# Patient Record
Sex: Female | Born: 1950
Health system: Southern US, Community
[De-identification: ages and names within clinical notes are randomized; demographics above are authoritative.]

## PROBLEM LIST (undated history)

## (undated) DIAGNOSIS — R569 Unspecified convulsions: Secondary | ICD-10-CM

## (undated) DIAGNOSIS — D496 Neoplasm of unspecified behavior of brain: Secondary | ICD-10-CM

## (undated) DIAGNOSIS — D429 Neoplasm of uncertain behavior of meninges, unspecified: Secondary | ICD-10-CM

## (undated) DIAGNOSIS — R42 Dizziness and giddiness: Secondary | ICD-10-CM

## (undated) DIAGNOSIS — D493 Neoplasm of unspecified behavior of breast: Secondary | ICD-10-CM

## (undated) HISTORY — DX: Dizziness and giddiness: R42

## (undated) HISTORY — PX: BRAIN SURGERY: SHX531

## (undated) HISTORY — PX: BREAST SURGERY: SHX581

## (undated) HISTORY — DX: Neoplasm of uncertain behavior of meninges, unspecified: D42.9

## (undated) HISTORY — DX: Unspecified convulsions: R56.9

---

## 2001-08-21 ENCOUNTER — Emergency Department (HOSPITAL_COMMUNITY): Admission: EM | Admit: 2001-08-21 | Discharge: 2001-08-21 | Payer: Self-pay | Admitting: Emergency Medicine

## 2001-09-18 ENCOUNTER — Ambulatory Visit (HOSPITAL_COMMUNITY): Admission: RE | Admit: 2001-09-18 | Discharge: 2001-09-18 | Payer: Self-pay | Admitting: Neurology

## 2001-12-04 ENCOUNTER — Encounter: Payer: Self-pay | Admitting: Emergency Medicine

## 2001-12-05 ENCOUNTER — Inpatient Hospital Stay (HOSPITAL_COMMUNITY): Admission: EM | Admit: 2001-12-05 | Discharge: 2001-12-06 | Payer: Self-pay | Admitting: Emergency Medicine

## 2002-02-12 ENCOUNTER — Emergency Department (HOSPITAL_COMMUNITY): Admission: EM | Admit: 2002-02-12 | Discharge: 2002-02-13 | Payer: Self-pay | Admitting: Emergency Medicine

## 2002-02-12 ENCOUNTER — Encounter: Payer: Self-pay | Admitting: Emergency Medicine

## 2002-05-02 ENCOUNTER — Emergency Department (HOSPITAL_COMMUNITY): Admission: EM | Admit: 2002-05-02 | Discharge: 2002-05-02 | Payer: Self-pay | Admitting: Emergency Medicine

## 2002-05-21 ENCOUNTER — Other Ambulatory Visit: Admission: RE | Admit: 2002-05-21 | Discharge: 2002-05-21 | Payer: Self-pay | Admitting: Obstetrics and Gynecology

## 2004-05-22 ENCOUNTER — Other Ambulatory Visit: Admission: RE | Admit: 2004-05-22 | Discharge: 2004-05-22 | Payer: Self-pay | Admitting: Obstetrics and Gynecology

## 2005-03-16 ENCOUNTER — Emergency Department (HOSPITAL_COMMUNITY): Admission: EM | Admit: 2005-03-16 | Discharge: 2005-03-16 | Payer: Self-pay | Admitting: Emergency Medicine

## 2006-09-12 ENCOUNTER — Encounter: Admission: RE | Admit: 2006-09-12 | Discharge: 2006-09-12 | Payer: Self-pay | Admitting: Obstetrics and Gynecology

## 2006-09-26 ENCOUNTER — Emergency Department (HOSPITAL_COMMUNITY): Admission: EM | Admit: 2006-09-26 | Discharge: 2006-09-27 | Payer: Self-pay | Admitting: Emergency Medicine

## 2006-11-13 ENCOUNTER — Inpatient Hospital Stay (HOSPITAL_COMMUNITY): Admission: EM | Admit: 2006-11-13 | Discharge: 2006-11-14 | Payer: Self-pay | Admitting: Emergency Medicine

## 2007-09-24 ENCOUNTER — Encounter: Admission: RE | Admit: 2007-09-24 | Discharge: 2007-09-24 | Payer: Self-pay | Admitting: Obstetrics and Gynecology

## 2007-10-01 ENCOUNTER — Encounter: Admission: RE | Admit: 2007-10-01 | Discharge: 2007-10-01 | Payer: Self-pay | Admitting: Obstetrics and Gynecology

## 2007-11-20 ENCOUNTER — Encounter: Payer: Self-pay | Admitting: Internal Medicine

## 2008-01-04 ENCOUNTER — Ambulatory Visit: Payer: Self-pay | Admitting: Internal Medicine

## 2008-01-04 DIAGNOSIS — G40209 Localization-related (focal) (partial) symptomatic epilepsy and epileptic syndromes with complex partial seizures, not intractable, without status epilepticus: Secondary | ICD-10-CM | POA: Insufficient documentation

## 2008-01-04 DIAGNOSIS — F32A Depression, unspecified: Secondary | ICD-10-CM | POA: Insufficient documentation

## 2008-01-04 DIAGNOSIS — E785 Hyperlipidemia, unspecified: Secondary | ICD-10-CM

## 2008-01-04 DIAGNOSIS — F329 Major depressive disorder, single episode, unspecified: Secondary | ICD-10-CM

## 2008-01-06 ENCOUNTER — Encounter: Payer: Self-pay | Admitting: Internal Medicine

## 2008-04-11 ENCOUNTER — Ambulatory Visit: Payer: Self-pay | Admitting: Internal Medicine

## 2008-10-28 IMAGING — MG MM DIAGNOSTIC BILATERAL
3 series · 3 of 3 positions shown · non-contrast
Comparison: none

DG DIAGNOSTIC BILATERAL
Bilateral CC and MLO view(s) were taken.

RIGHT BREAST ULTRASOUND
Technologist: Lorenz Jumper, Medical
DIGITAL BILATERAL DIAGNOSTIC MAMMOGRAM WITH CAD AND RIGHT BREAST ULTRASOUND:
CLINICAL DATA: Right breast lump at 3 o'clock.

[L MLO]
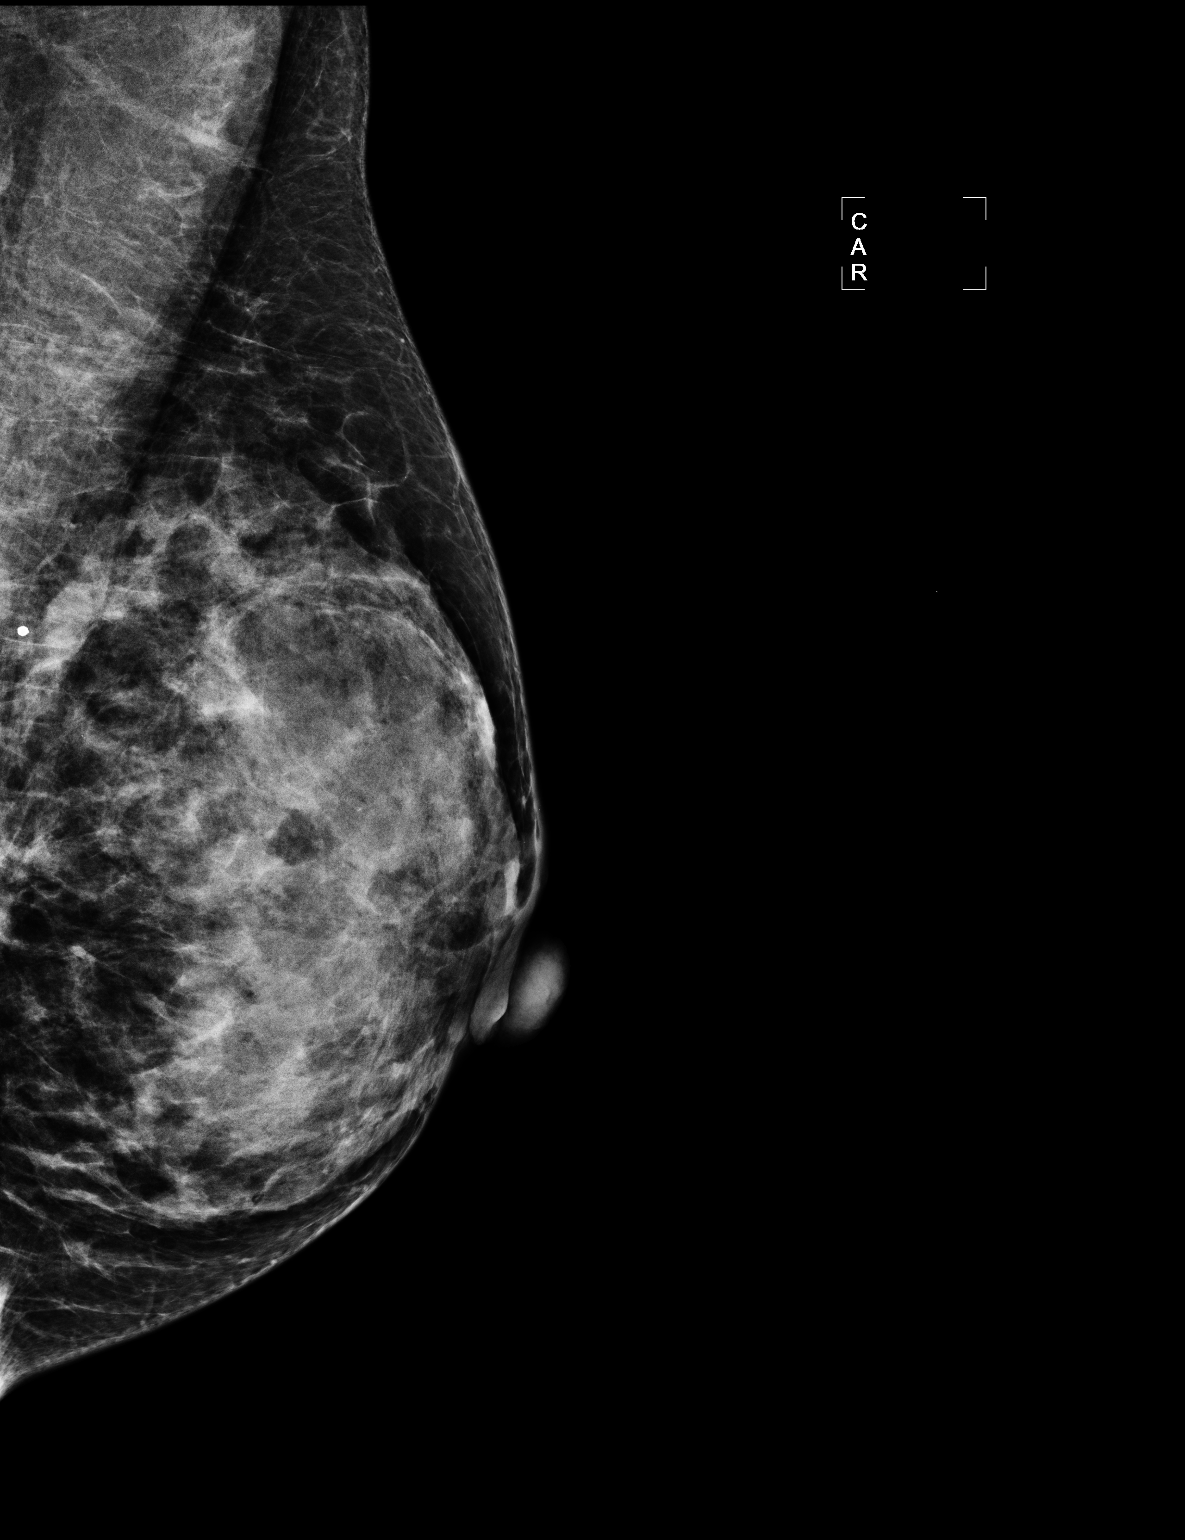

[R MLO]
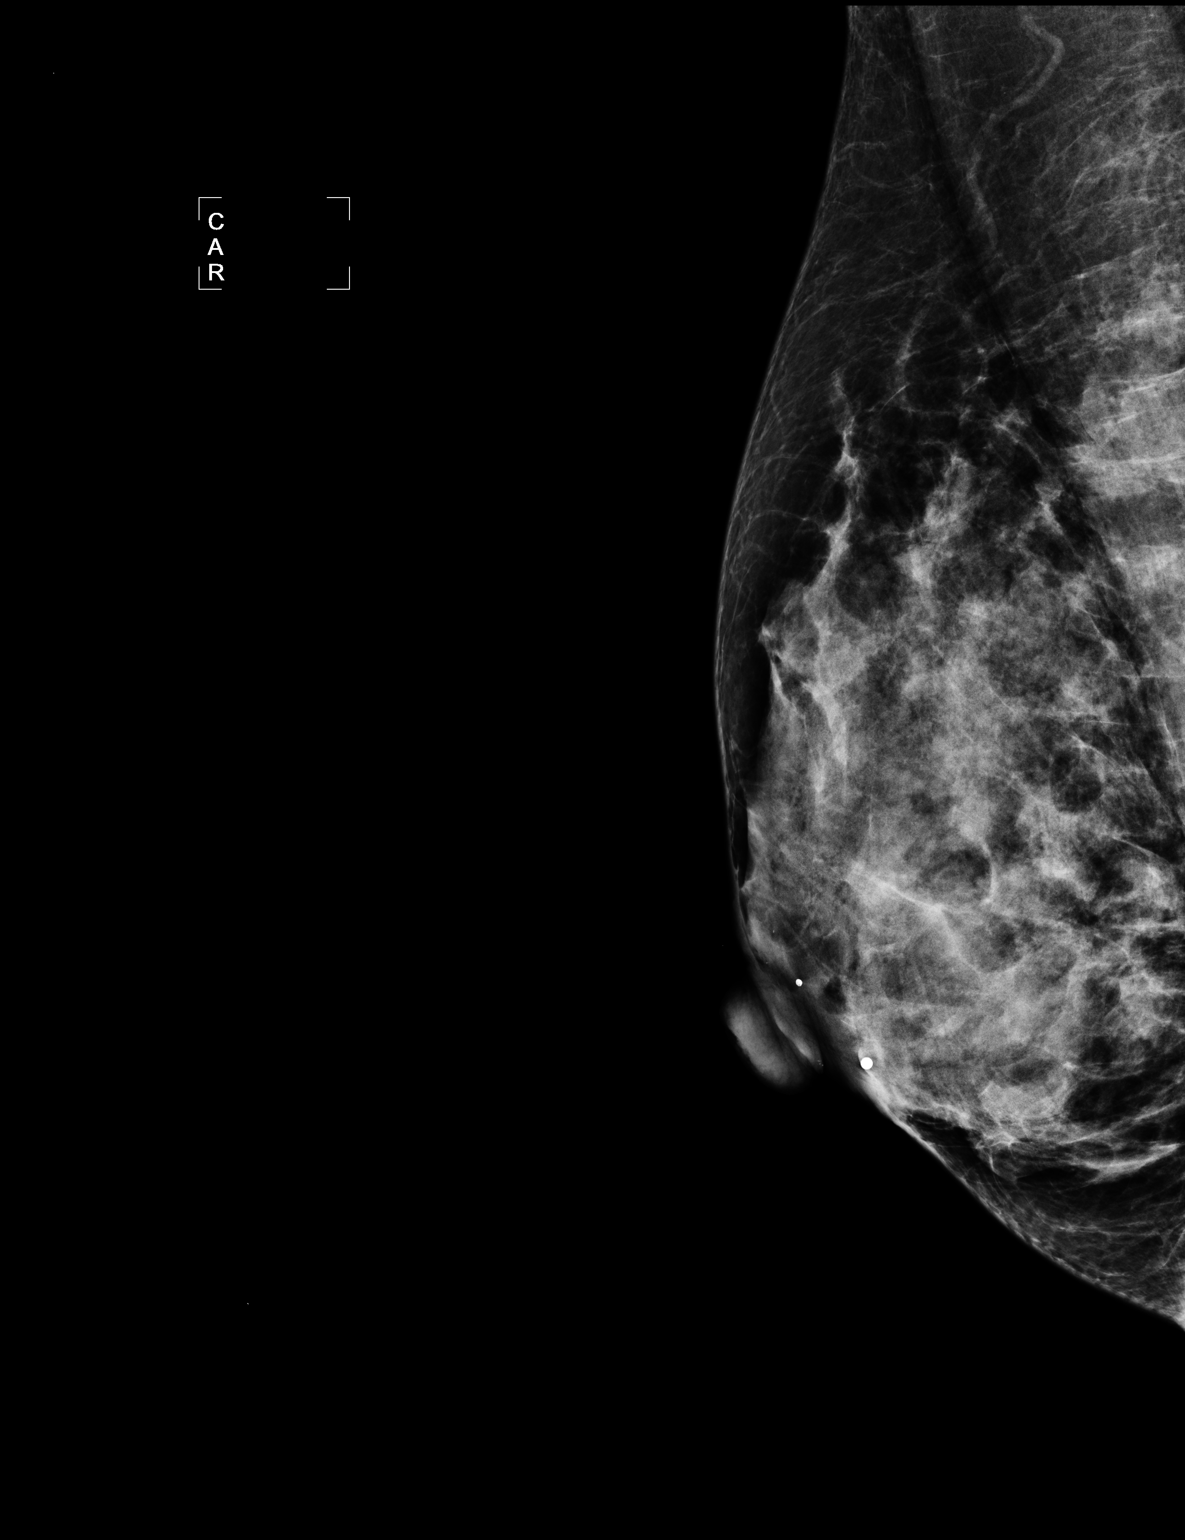

[R TAN]
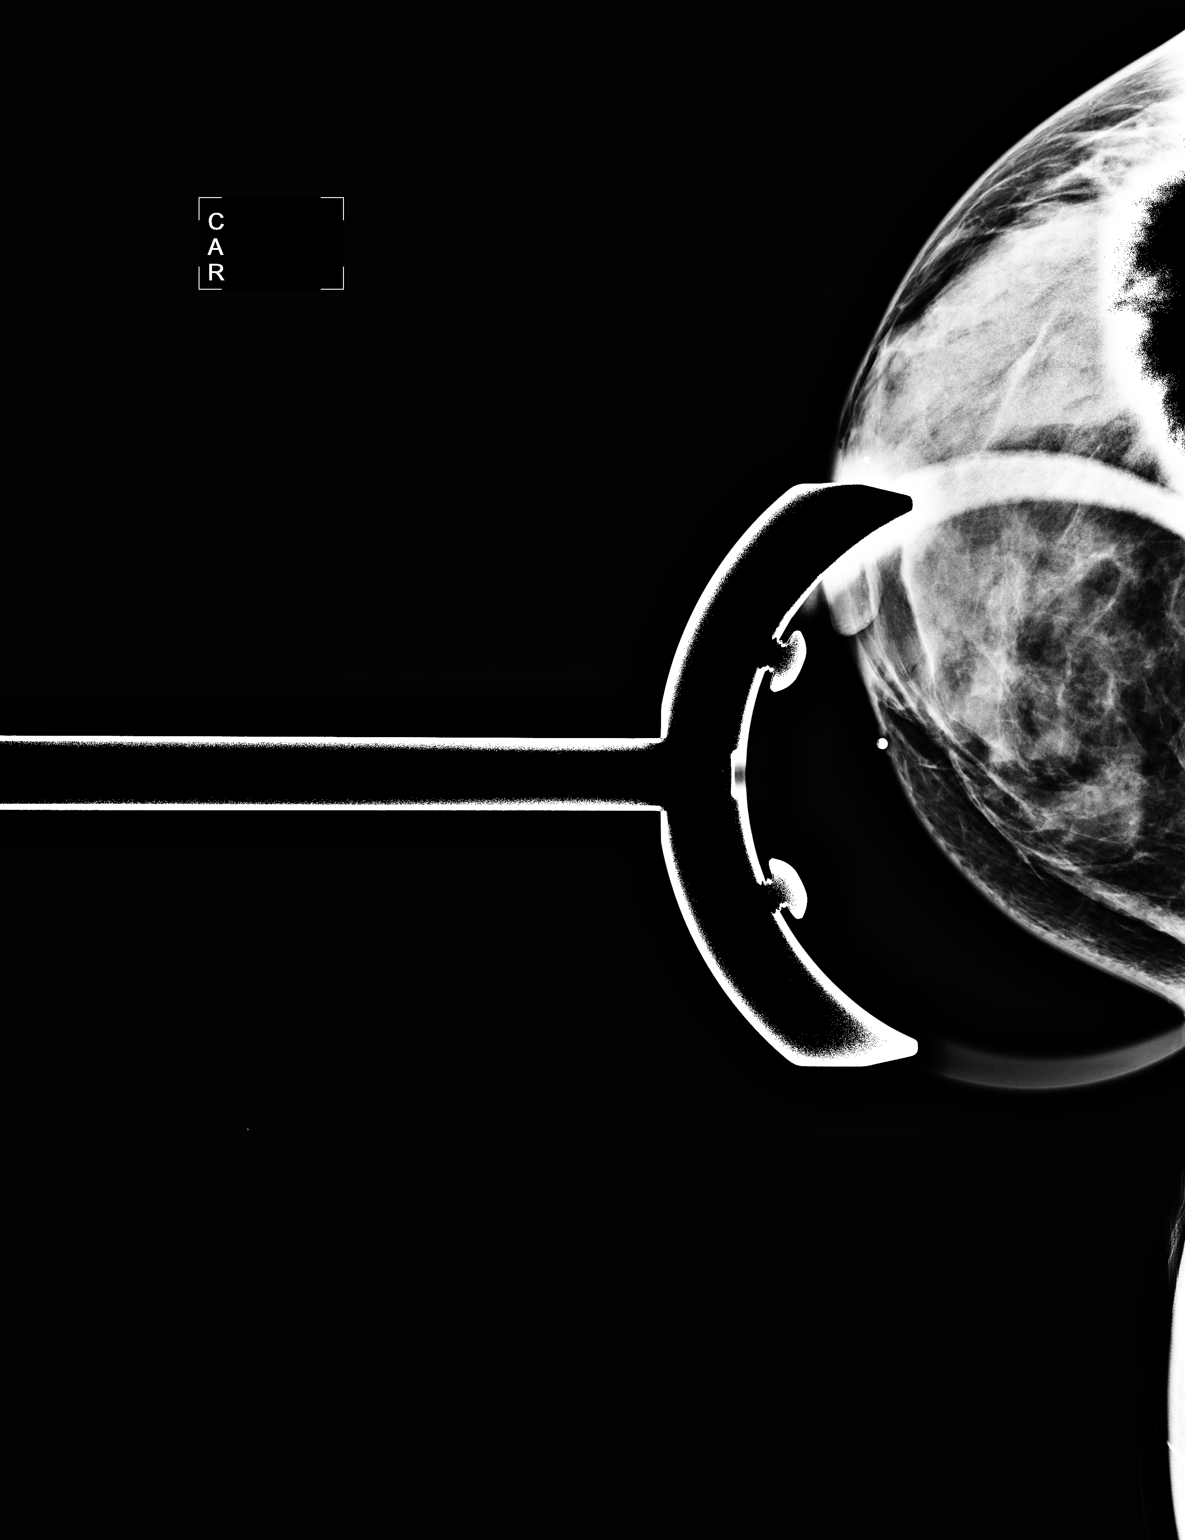

[3 of 3 positions shown; findings below may reference images not displayed]

The dense fibroglandular tissue is symmetric and stable.  Minimal post surgical distortion is 
present within the inner right breast in the area of palpable concern.  This is unchanged compared 
with the 05-23-03 mammogram from  [HOSPITAL] OB/GY[facility].

Physical exam today, I palpate soft mobile tissue medially on the right.  Ultrasound of this region
reveals normal but dense fibroglandular tissue only.
IMPRESSION: There is no specific radiographic evidence of malignancy bilaterally.  Screening mammogram in one 
year is recommended, unless otherwise clinically indicated.

ASSESSMENT: Benign - BI-RADS 2

Routine screening mammogram in 1 year.
ANALYZED BY COMPUTER AIDED DETECTION. ,

## 2008-12-21 ENCOUNTER — Encounter: Admission: RE | Admit: 2008-12-21 | Discharge: 2008-12-21 | Payer: Self-pay | Admitting: Obstetrics and Gynecology

## 2009-12-27 ENCOUNTER — Encounter: Admission: RE | Admit: 2009-12-27 | Discharge: 2009-12-27 | Payer: Self-pay | Admitting: Obstetrics and Gynecology

## 2010-03-18 ENCOUNTER — Encounter: Payer: Self-pay | Admitting: Neurology

## 2010-03-19 ENCOUNTER — Encounter: Payer: Self-pay | Admitting: Obstetrics and Gynecology

## 2010-07-10 NOTE — Discharge Summary (Signed)
NAME:  Vickie Gross, REMPEL                   ACCOUNT NO.:  [id number]   MEDICAL RECORD NO.:  [id number]          PATIENT TYPE:  INP   LOCATION:  6703                         FACILITY:  [facility]   PHYSICIAN:  Marlan Palau, M.D.  DATE OF BIRTH:  [birth year]   DATE OF ADMISSION:  11/12/2006  DATE OF DISCHARGE:  11/14/2006                               DISCHARGE SUMMARY   ADDENDUM:  To discharge summary done on the November 13, 2006, this is  on November 14, 2006.   This is a 60 year old [place] female born 05/22/1950 with a  history of a seizure order associated with encephalomalacia of the  frontal lobes bilaterally following a meningioma resection.  The patient  has medical noncompliance when off both her Dilantin and Lamictal dosing  regimens and came in with status epilepticus.  Plans were for discharge  on November 13, 2006, but the patient developed a postictal psychosis  with severe agitation requiring Seroquel, Haldol injections and  restraints.  The patient has been able to sleep overnight, has resolved  psychosis, and appears to be getting back to her baseline.  The patient  is oriented to person, place, month, year, but not to date.  The patient  moves all fours, follows commands well, is quite calm at this point.   PLANS:  For discharge to home today taking medication regimen previously  delineated in the prior discharge summary.  The patient will need to  followup with [facility] in 4-6 weeks.  The Seroquel  and Haldol will be discontinued.      Marlan Palau, M.D.  Electronically Signed     CKW/MEDQ  D:  11/14/2006  T:  11/14/2006  Job:  [id number]   cc:   [id number]  [address] [place]  [place] [facility]

## 2010-07-10 NOTE — Discharge Summary (Signed)
NAME:  Vickie Gross, Vickie Gross                   ACCOUNT NO.:  [id number]   MEDICAL RECORD NO.:  [id number]          PATIENT TYPE:  INP   LOCATION:  6703                         FACILITY:  [facility]   PHYSICIAN:  Marlan Palau, M.D.  DATE OF BIRTH:  1950-05-09   DATE OF ADMISSION:  11/12/2006  DATE OF DISCHARGE:                               DISCHARGE SUMMARY   ADMISSION DIAGNOSES:  1. Status epilepticus.  2. Medical noncompliance.   DISCHARGE DIAGNOSIS:  1. Status epilepticus.  2. Medical noncompliance.  3. Meningioma status post resection with bifrontal encephalomalacia.   PROCEDURES:  During this admission include CT scan of the brain.   COMPLICATIONS:  None.   HISTORY OF PRESENT ILLNESS:  Vickie Gross is a 60 year old right-handed  [place] female born 19-Nov-1950 with a history of a meningioma  status post resection in the frontal area, resulting in bifrontal  encephalomalacia.  The patient had a small recurrence of the meningioma  in the frontal area.  Patient has been on Dilantin and Lamictal,  followed by Dr. Thad Ranger.  Patient has had problems in the past with  medical noncompliance, oftentimes does not take her medication doses  particularly in the evening.  The patient has come into the hospital  with series of at least five seizures back-to-back without full recovery  between the events, leading to diagnosis of status epilepticus.  The  patient was given IV Dilantin load 1 gram in the emergency room and was  admitted for observation.  The patient has not been taking Lamictal on a  regular basis either.  Neurology was called for an evaluation.   PAST MEDICAL HISTORY:  1. History of seizure disorder.  2. Status epilepticus on this admission.  3. Medical noncompliance.  4. History of depression.  5. Meningioma, status post resection with bifrontal encephalomalacia.   MEDICATIONS:  1. Dilantin 200 mg b.i.d.  2. Lamictal 200 mg b.i.d.  3. Zoloft 50 mg daily.   ALLERGIES:   The patient had has no known allergies.   SOCIAL HISTORY:  Does not currently smoke or drink.   Please refer to history and physical dictation summary for social  history, family history, review of systems and physical examination.   LABORATORY VALUES:  Notable for a white count of 13.3 that has come down  to 9.3, hemoglobin of 11.4, hematocrit 33.3, MCV 84.6, platelets of 175.  Sodium of 137, potassium 5.5, chloride of 104, CO2 23, glucose of 117,  BUN 14, creatinine of 0.7, SGOT 37, SGPT of 13, alk phosphatase of 58,  total protein 6.8, albumin 4.1, calcium of 8.9.  Urine drug screen  positive for benzodiazepines, otherwise negative.  Urinalysis reveals  specific gravity of 0.016, pH of 7.5, otherwise unremarkable.   HOSPITAL COURSE:  The patient has done well during the course of  hospitalization.  Patient has an unusual affect.  The patient has had  problems with keeping up with the medications and taking medications on  a regular basis.  The patient was admitted for observation.  A CT scan  of the brain showed  no acute changes but did show a significant  bifrontal encephalomalacia that is likely the source of her seizures.  The patient was placed back on Dilantin 200 mg twice daily, Lamictal 200  mg twice daily, Zoloft 50 mg a day.  The patient is to be discharged on  the above medications without change.  I have stressed that she needs to  take her medications regularly.  The patient had low grade temperature.  Will check a chest x-ray prior to discharge.   The patient, at this time, is bright, alert, cooperative, moves all  fours, full visual fields, speech normal, oriented x3.  The patient will  need to follow up with [facility] within 4-6 weeks.  Needs to have blood work checked at that point.  The patient is not  driving.      Marlan Palau, M.D.  Electronically Signed     CKW/MEDQ  D:  11/13/2006  T:  11/13/2006  Job:  [id number]   cc:   Marlan Palau, M.D.

## 2010-07-10 NOTE — H&P (Signed)
NAME:  Vickie Gross                   ACCOUNT NO.:  [id number]   MEDICAL RECORD NO.:  [id number]          PATIENT TYPE:  INP   LOCATION:  6703                         FACILITY:  [facility]   PHYSICIAN:  Marlan Palau, M.D.  DATE OF BIRTH:  Mar 26, 1950   DATE OF ADMISSION:  11/12/2006  DATE OF DISCHARGE:                              HISTORY & PHYSICAL   HISTORY OF PRESENT ILLNESS:  Vickie Gross is a 60 year old right-handed  Oriental/Asian female born [birth year] with a history of seizures.  The patient comes to [facility] emergency room today after the onset of  seizures that began around 3 o'clock p.m.  The patient has been on  Dilantin and Lamictal, but apparently has not been taking either one of  the medications properly, tended to drop off doses here and there, not  taking the evening dose.  The patient apparently had a series of at  least five seizures, one right after the next, consistent with a  diagnosis of status epilepticus.  The patient was treated with Ativan,  and the seizures have stopped.  The patient is getting an IV load of  Dilantin currently, and is being admitted for further management of her  seizure disorder.  Dilantin level was less than 2.5.   PAST MEDICAL HISTORY:  1. History of seizure disorder with recent recurrence and status      epilepticus.  2. Medical noncompliance.  3. History of depression.  4. History of meningioma status post resection with bifrontal      encephalomalacia.   MEDICATIONS:  1. Dilantin 200 mg twice daily.  2. Lamictal 200 mg twice daily.  3. Zoloft 50 mg a day.   SOCIAL HISTORY:  The patient does not smoke or drink.   ALLERGIES:  Has no known allergies.   SOCIAL HISTORY:  Patient is married. Lives in the [place] area.  Has one daughter who is alive and well.  The patient  does not work.   FAMILY MEDICAL HISTORY:  Mother died with cancer.  Father died with  diabetes.  Patient has 2 brothers, 3 sisters; 1  sister with diabetes, 1  with cancer.   REVIEW OF SYSTEMS:  Notable for no recent fevers, chills.  The patient  does note some headache, soreness of the muscles, some sensation of  being weak all over.  Denies any numbness in the arms and legs.  Denies any shortness of breath, chest pains, abdominal pains, troubles  controlling the bowels or the bladder.   PHYSICAL EXAMINATION:  VITALS:  Blood pressure is 90/58, heart rate 72,  respiratory rate 15, temperature is 99.9 orally.  GENERAL:  This patient is a well-developed [place] female who is sleepy  but can be aroused at the time of examination.  HEENT:  Head is atraumatic.  Eyes - pupils equal, round, reactive to  light.  Disks are flat bilaterally.  NECK:  Supple.  No carotid bruits noted.  RESPIRATORY:  Clear.  CARDIOVASCULAR:  Regular rate and rhythm.  No obvious murmurs or rubs  noted.  EXTREMITIES:  Without significant edema.  ABDOMEN:  Positive bowel sounds.  No organomegaly or tenderness noted.  NEUROLOGIC:  Cranial nerves as above.  Facial symmetry is present.  Patient has good sensation of the face to pinprick. soft touch  bilaterally.  Has good strength of facial muscles, muscles or head  turning and shoulder shrug bilaterally.  Speech is fairly well  enunciated, not aphasic.  Extraocular movements, again, are full.  The  patient will not cooperate for full visual field testing.  Motor testing  reveals good symmetric strength in all fours, good finger-nose-finger  noted bilaterally.  The patient is not performing toe-to-finger.  Deep  tendon reflexes symmetric.  Normal toes, neutral bilaterally.  The  patient has good pinprick, soft touch, vibratory sensation throughout.  The patient was not ambulated.   CT of the head shows bifrontal encephalomalacia with a small recurrent  frontal meningioma in the midline. Blood work reveals white count of  13.3, hemoglobin of 13.0, hematocrit of 38.3, platelets of 171.  Sodium  137,  potassium 5.5, chloride 104, CO2 of 23, glucose of 117, BUN of 14,  creatinine 0.87, calcium 8.9, total protein 6.8, albumin 4.1, AST of 37.  Dilantin level less than 2.5.  Urine drug screen and urinalysis are  pending.   IMPRESSION:  1. History of seizure disorder with recent recurrence.  2. Medical noncompliance.   This patient has not been taking her medications properly and has  dropped off doses of both Dilantin and Lamictal, subsequently leading to  status epilepticus.  The patient had seizures 2 months ago with a  similar scenario.  The patient comes in at this point for management of  her seizures.   PLAN:  1. Restart Lamictal and Dilantin.  2. Dilantin load 1000 mg now and 200 mg twice daily.  3. The patient will follow up with [facility]      following this admission with close follow-up of drug levels.  4. The patient will have seizure precautions.      Marlan Palau, M.D.  Electronically Signed     CKW/MEDQ  D:  11/13/2006  T:  11/13/2006  Job:  [id number]   cc:   [facility]
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
## 2010-12-06 LAB — CBC
HCT: 38.3
Hemoglobin: 13
MCHC: 34
MCV: 84.4
Platelets: 171
RBC: 3.93
RBC: 4.54
RDW: 14
WBC: 13.3 — ABNORMAL HIGH
WBC: 9.3

## 2010-12-06 LAB — COMPREHENSIVE METABOLIC PANEL
ALT: 13
AST: 37
CO2: 23
Calcium: 8.9
Chloride: 104
GFR calc Af Amer: >60
GFR calc non Af Amer: >60
Sodium: 137

## 2010-12-06 LAB — COMPREHENSIVE METABOLIC PANEL WITH GFR
Albumin: 4.1
Alkaline Phosphatase: 58
BUN: 14
Creatinine, Ser: 0.87
Glucose, Bld: 117 — ABNORMAL HIGH
Potassium: 5.5 — ABNORMAL HIGH
Total Bilirubin: 0.7
Total Protein: 6.8

## 2010-12-06 LAB — DIFFERENTIAL
Basophils Absolute: 0
Basophils Relative: 0
Basophils Relative: 0
Eosinophils Absolute: 0
Eosinophils Relative: 0
Eosinophils Relative: 0
Lymphocytes Relative: 7 — ABNORMAL LOW
Lymphs Abs: 1
Monocytes Absolute: 0.7
Monocytes Absolute: 0.7
Monocytes Relative: 5
Monocytes Relative: 7
Neutro Abs: 11.6 — ABNORMAL HIGH
Neutro Abs: 6.7
Neutrophils Relative %: 73
Neutrophils Relative %: 87 — ABNORMAL HIGH

## 2010-12-06 LAB — PHENYTOIN LEVEL, TOTAL: Phenytoin Lvl: <2.5 — ABNORMAL LOW

## 2010-12-06 LAB — URINALYSIS, ROUTINE W REFLEX MICROSCOPIC
Bilirubin Urine: NEGATIVE
Glucose, UA: NEGATIVE
Hgb urine dipstick: NEGATIVE
Ketones, ur: 15 — AB
pH: 7.5

## 2010-12-06 LAB — RAPID URINE DRUG SCREEN, HOSP PERFORMED
Amphetamines: NOT DETECTED
Benzodiazepines: POSITIVE — AB
Cocaine: NOT DETECTED
Tetrahydrocannabinol: NOT DETECTED

## 2010-12-06 LAB — BASIC METABOLIC PANEL
Calcium: 9
Creatinine, Ser: 0.79
GFR calc Af Amer: >60

## 2010-12-06 LAB — CK: Total CK: 270 — ABNORMAL HIGH

## 2010-12-10 LAB — CBC
Hemoglobin: 12.2
MCHC: 34.4
Platelets: 198
RDW: 13.8

## 2010-12-10 LAB — DIFFERENTIAL
Lymphs Abs: 2.2
Monocytes Relative: 7
Neutro Abs: 3.1
Neutrophils Relative %: 53

## 2010-12-10 LAB — COMPREHENSIVE METABOLIC PANEL
ALT: 16
Albumin: 4.1
BUN: 17
Calcium: 8.9
Glucose, Bld: 117 — ABNORMAL HIGH
Sodium: 136
Total Protein: 6.8

## 2010-12-10 LAB — URINALYSIS, ROUTINE W REFLEX MICROSCOPIC
Glucose, UA: NEGATIVE
Specific Gravity, Urine: 1.027
pH: 6.5

## 2010-12-10 LAB — URINE MICROSCOPIC-ADD ON

## 2011-05-31 ENCOUNTER — Telehealth: Payer: Self-pay | Admitting: Internal Medicine

## 2011-05-31 DIAGNOSIS — L253 Unspecified contact dermatitis due to other chemical products: Secondary | ICD-10-CM | POA: Diagnosis not present

## 2011-05-31 NOTE — Telephone Encounter (Signed)
ROV today 

## 2011-05-31 NOTE — Telephone Encounter (Signed)
Please have pt come in today 215p

## 2011-05-31 NOTE — Telephone Encounter (Signed)
Pts spouse called and said that his wife may have shingles. Pt has painful rash on forehead, neck and back. Pt was last seen by Dr Amador Cunas in 2010. Req to come in for ov. Pt suffers from seizures if she doesn't get any rest.

## 2011-05-31 NOTE — Telephone Encounter (Signed)
Spoke with husband and they are going to the dermatologist

## 2011-05-31 NOTE — Telephone Encounter (Signed)
Pts spouse called and said that pt will not be coming in today for ov. Situation has been taken care of.

## 2011-09-06 DIAGNOSIS — R221 Localized swelling, mass and lump, neck: Secondary | ICD-10-CM | POA: Diagnosis not present

## 2011-09-06 DIAGNOSIS — R220 Localized swelling, mass and lump, head: Secondary | ICD-10-CM | POA: Diagnosis not present

## 2011-09-06 DIAGNOSIS — Z9889 Other specified postprocedural states: Secondary | ICD-10-CM | POA: Diagnosis not present

## 2011-09-06 DIAGNOSIS — C700 Malignant neoplasm of cerebral meninges: Secondary | ICD-10-CM | POA: Diagnosis not present

## 2011-09-06 DIAGNOSIS — D320 Benign neoplasm of cerebral meninges: Secondary | ICD-10-CM | POA: Diagnosis not present

## 2011-09-16 DIAGNOSIS — D429 Neoplasm of uncertain behavior of meninges, unspecified: Secondary | ICD-10-CM | POA: Diagnosis not present

## 2011-09-16 DIAGNOSIS — G40219 Localization-related (focal) (partial) symptomatic epilepsy and epileptic syndromes with complex partial seizures, intractable, without status epilepticus: Secondary | ICD-10-CM | POA: Diagnosis not present

## 2012-01-13 DIAGNOSIS — F3289 Other specified depressive episodes: Secondary | ICD-10-CM | POA: Diagnosis not present

## 2012-01-13 DIAGNOSIS — F329 Major depressive disorder, single episode, unspecified: Secondary | ICD-10-CM | POA: Diagnosis not present

## 2012-01-13 DIAGNOSIS — G40219 Localization-related (focal) (partial) symptomatic epilepsy and epileptic syndromes with complex partial seizures, intractable, without status epilepticus: Secondary | ICD-10-CM | POA: Diagnosis not present

## 2012-03-25 DIAGNOSIS — Z124 Encounter for screening for malignant neoplasm of cervix: Secondary | ICD-10-CM | POA: Diagnosis not present

## 2012-03-25 DIAGNOSIS — R87610 Atypical squamous cells of undetermined significance on cytologic smear of cervix (ASC-US): Secondary | ICD-10-CM | POA: Diagnosis not present

## 2012-03-25 DIAGNOSIS — Z01419 Encounter for gynecological examination (general) (routine) without abnormal findings: Secondary | ICD-10-CM | POA: Diagnosis not present

## 2012-03-25 DIAGNOSIS — Z1231 Encounter for screening mammogram for malignant neoplasm of breast: Secondary | ICD-10-CM | POA: Diagnosis not present

## 2012-04-20 DIAGNOSIS — L659 Nonscarring hair loss, unspecified: Secondary | ICD-10-CM | POA: Diagnosis not present

## 2012-04-20 DIAGNOSIS — Z1211 Encounter for screening for malignant neoplasm of colon: Secondary | ICD-10-CM | POA: Diagnosis not present

## 2012-04-20 DIAGNOSIS — Z13220 Encounter for screening for lipoid disorders: Secondary | ICD-10-CM | POA: Diagnosis not present

## 2012-05-05 ENCOUNTER — Telehealth: Payer: Self-pay | Admitting: Internal Medicine

## 2012-05-05 NOTE — Telephone Encounter (Signed)
Pt used to be a pt of yours. Not been seen by you since 2010.  [facility] called to ask if you would see this pt again.  Cholesterol and a1c readings are not good. Medicare pt.  Has had  previous health issues. Pls advise

## 2012-05-05 NOTE — Telephone Encounter (Signed)
yes

## 2012-05-07 NOTE — Telephone Encounter (Signed)
Has this been received?

## 2012-05-07 NOTE — Telephone Encounter (Signed)
Yes and appt has been set for 3/27.

## 2012-05-21 ENCOUNTER — Ambulatory Visit (INDEPENDENT_AMBULATORY_CARE_PROVIDER_SITE_OTHER): Payer: Medicare Other | Admitting: Internal Medicine

## 2012-05-21 ENCOUNTER — Encounter: Payer: Self-pay | Admitting: Internal Medicine

## 2012-05-21 VITALS — BP 108/70 | HR 56 | Temp 98.2°F | Resp 16 | Ht 64.5 in | Wt 129.0 lb

## 2012-05-21 DIAGNOSIS — R569 Unspecified convulsions: Secondary | ICD-10-CM

## 2012-05-21 DIAGNOSIS — E785 Hyperlipidemia, unspecified: Secondary | ICD-10-CM | POA: Diagnosis not present

## 2012-05-21 DIAGNOSIS — Z0000 Encounter for general adult medical examination without abnormal findings: Secondary | ICD-10-CM | POA: Diagnosis not present

## 2012-05-21 NOTE — Progress Notes (Signed)
Subjective:    Patient ID: Vickie Gross, female    DOB: [birth year], 62 y.o.   MRN: [id number]  HPI  62 year old patient who is seen today to reestablish with our practice. She has a history of dyslipidemia with a high HDL and was placed on simvastatin a few years ago.  A recent lipid profile was reviewed with a total cholesterol of 307 HDL cholesterol 122 and a calculated LDL of 173; cholesterol/HDL ratio 2.5 which is less than one half average risk.  Patient does have a history of impaired glucose tolerance with a recent hemoglobin A1c of 6.3. She has a strong family history of diabetes. 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 She had two surgeries performed in [place] and in January of 2009 had a third neurosurgical procedure at [facility]. This was followed by radiation treatment. She is followed closely at [facility] as well as locally by neurology  Current Allergies:  No known allergies   Past Medical History:  Reviewed history and no changes required:   Seizure disorder  status post resection of olfactory groove meningioma  Hyperlipidemia  Depression   Past Surgical History:  Reviewed history and no changes required:   status post craniotomy, 1998, followed by gamma knife procedure 1998.  meningioma surgery in 2008 followed by RT at [facility]  Status post brain surgery 2013  Status post lumpectomy. Breasts 2000 2003 2007  colposcopy with excision 2009  Gravida 3, para two  No colonoscopy   Family History:   father died at 68, complications of diabetes, and coronary artery disease  mother died age 5, lung cancer, history of psoriasis  Two brothers, one died age 54 complications of alkaline cirrhosis and is rerolled after disease  Four sisters strongly positive for diabetes one sister with history of lung  cancer  Social History:  Reviewed history and no changes required:   Married  Never Smoked  Masters education  Risk Factors:  Tobacco use: never  History reviewed. No pertinent past medical history.  History   Social History  . Marital Status: Married    Spouse Name: N/A    Number of Children: N/A  . Years of Education: N/A   Occupational History  . Not on file.   Social History Main Topics  . Smoking status: Never Smoker   . Smokeless tobacco: Never Used  . Alcohol Use: No  . Drug Use: No  . Sexually Active: Not on file   Other Topics Concern  . Not on file   Social History Narrative  . No narrative on file    History reviewed. No pertinent past surgical history.  No family history on file.  No Known Allergies  No current outpatient prescriptions on file prior to visit.   No current facility-administered medications on file prior to visit.    BP 108/70  Pulse 56  Temp(Src) 98.2 F (36.8 C) (Oral)  Resp 16  Ht 5' 4.5" (1.638 m)  Wt 129 lb (58.514 kg)  BMI 21.81 kg/m2  SpO2 98%  LMP 03/11/1994   1. Risk factors, based on past  M,S,F history- patient has dyslipidemia with a high HDL cholesterol. She has impaired glucose tolerance  2.  Physical activities: No exercise limitations 3.  Depression/mood: history depression which has been stable  4.  Hearing: No major deficits  5.  ADL's: Independent in all  aspects of daily living  6.  Fall risk: Low  7.  Home safety: no problems identified  8.  Height weight, and visual acuity; height and weight stable no change in visual acuity. Patient does have impaired vision from the left eye  9.  Counseling: Followup GYN and neurology encouraged. Exercise and diabetic diet also discussed and recommended  10. Lab orders based on risk factors: Followup hemoglobin A1c in 6 months  11. Referral : Follow GYN and neurology  12. Care plan: Heart healthy diet and exercise regimen encouraged  13. Cognitive  assessment: Alert and oriented normal affect. No cognitive dysfunction      Review of Systems  Constitutional: Negative for fever, appetite change, fatigue and unexpected weight change.  HENT: Negative for hearing loss, ear pain, nosebleeds, congestion, sore throat, mouth sores, trouble swallowing, neck stiffness, dental problem, voice change, sinus pressure and tinnitus.   Eyes: Negative for photophobia, pain, redness and visual disturbance.  Respiratory: Negative for cough, chest tightness and shortness of breath.   Cardiovascular: Negative for chest pain, palpitations and leg swelling.  Gastrointestinal: Negative for nausea, vomiting, abdominal pain, diarrhea, constipation, blood in stool, abdominal distention and rectal pain.  Genitourinary: Negative for dysuria, urgency, frequency, hematuria, flank pain, vaginal bleeding, vaginal discharge, difficulty urinating, genital sores, vaginal pain, menstrual problem and pelvic pain.  Musculoskeletal: Negative for back pain and arthralgias.  Skin: Negative for rash.  Neurological: Negative for dizziness, syncope, speech difficulty, weakness, light-headedness, numbness and headaches.  Hematological: Negative for adenopathy. Does not bruise/bleed easily.  Psychiatric/Behavioral: Negative for suicidal ideas, behavioral problems, self-injury, dysphoric mood and agitation. The patient is not nervous/anxious.        Objective:   Physical Exam  Constitutional: She is oriented to person, place, and time. She appears well-developed and well-nourished.  HENT:  Head: Normocephalic and atraumatic.  Right Ear: External ear normal.  Left Ear: External ear normal.  Mouth/Throat: Oropharynx is clear and moist.  Eyes: Conjunctivae and EOM are normal.  Neck: Normal range of motion. Neck supple. No JVD present. No thyromegaly present.  Cardiovascular: Normal rate, regular rhythm, normal heart sounds and intact distal pulses.   No murmur  heard. Pulmonary/Chest: Effort normal and breath sounds normal. She has no wheezes. She has no rales.  Abdominal: Soft. Bowel sounds are normal. She exhibits no distension and no mass. There is no tenderness. There is no rebound and no guarding.  Musculoskeletal: Normal range of motion. She exhibits no edema and no tenderness.  Neurological: She is alert and oriented to person, place, and time. She has normal reflexes. No cranial nerve deficit. She exhibits normal muscle tone. Coordination normal.  Skin: Skin is warm and dry. No rash noted.  Psychiatric: She has a normal mood and affect. Her behavior is normal.          Assessment & Plan:  Preventive health examination   seizure disorder. Followup neurology Dyslipidemia. The patient has a perihilar HDL cholesterol and is not high risk. LDL cholesterol is elevated but less than 190. Options discussed and elected not to treat in view of overall low risk status Impaired glucose tolerance.  The patient has a strong family history of diabetes. Dietary information dispensed  Recheck 6 months with hemoglobin A1c

## 2012-05-21 NOTE — Patient Instructions (Addendum)
It is important that you exercise regularly, at least 20 minutes 3 to 4 times per week.  If you develop chest pain or shortness of breath seek  medical attention.Diabetes and Exercise Regular exercise is important and can help:   Control blood glucose (sugar).  Decrease blood pressure.    Control blood lipids (cholesterol, triglycerides).  Improve overall health. BENEFITS FROM EXERCISE  Improved fitness.  Improved flexibility.  Improved endurance.  Increased bone density.  Weight control.  Increased muscle strength.  Decreased body fat.  Improvement of the body's use of insulin, a hormone.  Increased insulin sensitivity.  Reduction of insulin needs.  Reduced stress and tension.  Helps you feel better. People with diabetes who add exercise to their lifestyle gain additional benefits, including:  Weight loss.  Reduced appetite.  Improvement of the body's use of blood glucose.  Decreased risk factors for heart disease:  Lowering of cholesterol and triglycerides.  Raising the level of good cholesterol (high-density lipoproteins, HDL).  Lowering blood sugar.  Decreased blood pressure. TYPE 1 DIABETES AND EXERCISE  Exercise will usually lower your blood glucose.  If blood glucose is greater than 240 mg/dl, check urine ketones. If ketones are present, do not exercise.  Location of the insulin injection sites may need to be adjusted with exercise. Avoid injecting insulin into areas of the body that will be exercised. For example, avoid injecting insulin into:  The arms when playing tennis.  The legs when jogging. For more information, discuss this with your caregiver.  Keep a record of:  Food intake.  Type and amount of exercise.  Expected peak times of insulin action.  Blood glucose levels. Do this before, during, and after exercise. Review your records with your caregiver. This will help you to develop guidelines for adjusting food intake and  insulin amounts.  TYPE 2 DIABETES AND EXERCISE  Regular physical activity can help control blood glucose.  Exercise is important because it may:  Increase the body's sensitivity to insulin.  Improve blood glucose control.  Exercise reduces the risk of heart disease. It decreases serum cholesterol and triglycerides. It also lowers blood pressure.  Those who take insulin or oral hypoglycemic agents should watch for signs of hypoglycemia. These signs include dizziness, shaking, sweating, chills, and confusion.  Body water is lost during exercise. It must be replaced. This will help to avoid loss of body fluids (dehydration) or heat stroke. Be sure to talk to your caregiver before starting an exercise program to make sure it is safe for you. Remember, any activity is better than none.  Document Released: 05/04/2003 Document Revised: 05/06/2011 Document Reviewed: 08/18/2008 [facility] Patient Information 2013 [place], [place]. Diabetes Meal Planning Guide The diabetes meal planning guide is a tool to help you plan your meals and snacks. It is important for people with diabetes to manage their blood glucose (sugar) levels. Choosing the right foods and the right amounts throughout your day will help control your blood glucose. Eating right can even help you improve your blood pressure and reach or maintain a healthy weight. CARBOHYDRATE COUNTING MADE EASY When you eat carbohydrates, they turn to sugar. This raises your blood glucose level. Counting carbohydrates can help you control this level so you feel better. When you plan your meals by counting carbohydrates, you can have more flexibility in what you eat and balance your medicine with your food intake. Carbohydrate counting simply means adding up the total amount of carbohydrate grams in your meals and snacks. Try to eat  about the same amount at each meal. Foods with carbohydrates are listed below. Each portion below is 1 carbohydrate serving or 15  grams of carbohydrates. Ask your dietician how many grams of carbohydrates you should eat at each meal or snack. Grains and Starches  1 slice bread.   English muffin or hotdog/hamburger bun.   cup cold cereal (unsweetened).   cup cooked pasta or rice.   cup starchy vegetables (corn, potatoes, peas, beans, winter squash).  1 tortilla (6 inches).   bagel.  1 waffle or pancake (size of a CD).   cup cooked cereal.  4 to 6 small crackers. *Whole grain is recommended. Fruit  1 cup fresh unsweetened berries, melon, papaya, pineapple.  1 small fresh fruit.   banana or mango.   cup fruit juice (4 oz unsweetened).   cup canned fruit in natural juice or water.  2 tbs dried fruit.  12 to 15 grapes or cherries. Milk and Yogurt  1 cup fat-free or 1% milk.  1 cup soy milk.  6 oz light yogurt with sugar-free sweetener.  6 oz low-fat soy yogurt.  6 oz plain yogurt. Vegetables  1 cup raw or  cup cooked is counted as 0 carbohydrates or a "free" food.  If you eat 3 or more servings at 1 meal, count them as 1 carbohydrate serving. Other Carbohydrates   oz chips or pretzels.   cup ice cream or frozen yogurt.   cup sherbet or sorbet.  2 inch square cake, no frosting.  1 tbs honey, sugar, jam, jelly, or syrup.  2 small cookies.  3 squares of graham crackers.  3 cups popcorn.  6 crackers.  1 cup broth-based soup.  Count 1 cup casserole or other mixed foods as 2 carbohydrate servings.  Foods with less than 20 calories in a serving may be counted as 0 carbohydrates or a "free" food. You may want to purchase a book or computer software that lists the carbohydrate gram counts of different foods. In addition, the nutrition facts panel on the labels of the foods you eat are a good source of this information. The label will tell you how big the serving size is and the total number of carbohydrate grams you will be eating per serving. Divide this number by 15  to obtain the number of carbohydrate servings in a portion. Remember, 1 carbohydrate serving equals 15 grams of carbohydrate. SERVING SIZES Measuring foods and serving sizes helps you make sure you are getting the right amount of food. The list below tells how big or small some common serving sizes are.  1 oz.........4 stacked dice.  3 oz........Mar[place] KitchenDeck of cards.  1 tsp.......[place] KitchenTip of little finger.  1 tbs......Marland KitchenMarland KitchenThumb.  2 tbs.......Marland KitchenGolf ball.   cup......Marland KitchenHalf of a fist.  1 cup.......Marland KitchenA fist. SAMPLE DIABETES MEAL PLAN Below is a sample meal plan that includes foods from the grain and starches, dairy, vegetable, fruit, and meat groups. A dietician can individualize a meal plan to fit your calorie needs and tell you the number of servings needed from each food group. However, controlling the total amount of carbohydrates in your meal or snack is more important than making sure you include all of the food groups at every meal. You may interchange carbohydrate containing foods (dairy, starches, and fruits). The meal plan below is an example of a 2000 calorie diet using carbohydrate counting. This meal plan has 17 carbohydrate servings. Breakfast  1 cup oatmeal (2 carb servings).   cup light yogurt (1  carb serving).  1 cup blueberries (1 carb serving).   cup almonds. Snack  1 large apple (2 carb servings).  1 low-fat string cheese stick. Lunch  Chicken breast salad.  1 cup spinach.   cup chopped tomatoes.  2 oz chicken breast, sliced.  2 tbs low-fat [place] dressing.  12 whole-wheat crackers (2 carb servings).  12 to 15 grapes (1 carb serving).  1 cup low-fat milk (1 carb serving). Snack  1 cup carrots.   cup hummus (1 carb serving). Dinner  3 oz broiled salmon.  1 cup brown rice (3 carb servings). Snack  1  cups steamed broccoli (1 carb serving) drizzled with 1 tsp olive oil and lemon juice.  1 cup light pudding (2 carb servings). DIABETES MEAL  PLANNING WORKSHEET Your dietician can use this worksheet to help you decide how many servings of foods and what types of foods are right for you.  BREAKFAST Food Group and Servings / Carb Servings Grain/Starches __________________________________ Dairy __________________________________________ Vegetable ______________________________________ Fruit ___________________________________________ Meat __________________________________________ Fat ____________________________________________ LUNCH Food Group and Servings / Carb Servings Grain/Starches ___________________________________ Dairy ___________________________________________ Fruit ____________________________________________ Meat ___________________________________________ Fat _____________________________________________ Laural Golden Food Group and Servings / Carb Servings Grain/Starches ___________________________________ Dairy ___________________________________________ Fruit ____________________________________________ Meat ___________________________________________ Fat _____________________________________________ SNACKS Food Group and Servings / Carb Servings Grain/Starches ___________________________________ Dairy ___________________________________________ Vegetable _______________________________________ Fruit ____________________________________________ Meat ___________________________________________ Fat _____________________________________________ DAILY TOTALS Starches _________________________ Vegetable ________________________ Fruit ____________________________ Dairy ____________________________ Meat ____________________________ Fat ______________________________ Document Released: 11/08/2004 Document Revised: 05/06/2011 Document Reviewed: 09/19/2008 [employer] Patient Information 2013 [place], [place]. Diabetes, Eating Away From Home Sometimes, you might eat in a restaurant or have meals that are prepared by  someone else. You can enjoy eating out. However, the portions in restaurants may be much larger than needed. Listed below are some ideas to help you choose foods that will keep your blood glucose (sugar) in better control.  TIPS FOR EATING OUT  Know your meal plan and how many carbohydrate servings you should have at each meal. You may wish to carry a copy of your meal plan in your purse or wallet. Learn the foods included in each food group.  Make a list of restaurants near you that offer healthy choices. Take a copy of the carry-out menus to see what they offer. Then, you can plan what you will order ahead of time.  Become familiar with serving sizes by practicing them at home using measuring cups and spoons. Once you learn to recognize portion sizes, you will be able to correctly estimate the amount of total carbohydrate you are allowed to eat at the restaurant. Ask for a takeout box if the portion is more than you should have. When your food comes, leave the amount you should have on the plate, and put the rest in the takeout box before you start eating.  Plan ahead if your mealtime will be different from usual. Check with your caregiver to find out how to time meals and medicine if you are taking insulin.  Avoid high-fat foods, such as fried foods, cream sauces, high-fat salad dressings, or any added butter or margarine.  Do not be afraid to ask questions. Ask your server about the portion size, cooking methods, ingredients and if items can be substituted. Restaurants do not list all available items on the menu. You can ask for your main entree to be prepared using skim milk, oil instead of butter or margarine, and without gravy or sauces. Ask your waiter or waitress to serve salad dressings, gravy,  sauces, margarine, and sour cream on the side. You can then add the amount your meal plan suggests.  Add more vegetables whenever possible.  Avoid items that are labeled "jumbo," "giant," "deluxe,"  or "supersized."  You may want to split an entre with someone and order an extra side salad.  Watch for hidden calories in foods like croutons, bacon, or cheese.  Ask your server to take away the bread basket or chips from your table.  Order a dinner salad as an appetizer. You can eat most foods served in a restaurant. Some foods are better choices than others. Breads and Starches  Recommended: All kinds of bread (wheat, rye, white, oatmeal, [place], [place], raisin), hard or soft dinner rolls, frankfurter or hamburger buns, small bagels, small corn or whole-wheat flour tortillas.  Avoid: Frosted or glazed breads, butter rolls, egg or cheese breads, croissants, sweet rolls, pastries, coffee cake, glazed or frosted doughnuts, muffins. Crackers  Recommended: Animal crackers, graham, rye, saltine, oyster, and matzoth crackers. Bread sticks, melba toast, rusks, pretzels, popcorn (without fat), zwieback toast.  Avoid: High-fat snack crackers or chips. Buttered popcorn. Cereals  Recommended: Hot and cold cereals. Whole grains such as oatmeal or shredded wheat are good choices.  Avoid: Sugar-coated or granola type cereals. Potatoes/Pasta/Rice/Beans  Recommended: Order baked, boiled, or mashed potatoes, rice or noodles without added fat, whole beans. Order gravies, butter, margarine, or sauces on the side so you can control the amount you add.  Avoid: Hash browns or fried potatoes. Potatoes, pasta, or rice prepared with cream or cheese sauce. Potato or pasta salads prepared with large amounts of dressing. Fried beans or fried rice. Vegetables  Recommended: Order steamed, baked, boiled, or stewed vegetables without sauces or extra fat. Ask that sauce be served on the side. If vegetables are not listed on the menu, ask what is available.  Avoid: Vegetables prepared with cream, butter, or cheese sauce. Fried vegetables. Salad Bars  Recommended: Many of the vegetables at a salad bar are  considered "free." Use lemon juice, vinegar, or low-calorie salad dressing (fewer than 20 calories per serving) as "free" dressings for your salad. Look for salad bar ingredients that have no added fat or sugar such as tomatoes, lettuce, cucumbers, broccoli, carrots, onions, and mushrooms.  Avoid: Prepared salads with large amounts of dressing, such as coleslaw, caesar salad, macaroni salad, bean salad, or carrot salad. Fruit  Recommended: Eat fresh fruit or fresh fruit salad without added dressing. A salad bar often offers fresh fruit choices, but canned fruit at a restaurant is usually packed in sugar or syrup.  Avoid: Sweetened canned or frozen fruits, plain or sweetened fruit juice. Fruit salads with dressing, sour cream, or sugar added to them. Meat and Meat Substitutes  Recommended: Order broiled, baked, roasted, or grilled meat, poultry, or fish. Trim off all visible fat. Do not eat the skin of poultry. The size stated on the menu is the raw weight. Meat shrinks by  in cooking (for example, 4 oz raw equals 3 oz cooked meat).  Avoid: Deep-fat fried meat, poultry, or fish. Breaded meats. Eggs  Recommended: Order soft, hard-cooked, poached, or scrambled eggs. Omelets may be okay, depending on what ingredients are added. Egg substitutes are also a good choice.  Avoid: Fried eggs, eggs prepared with cream or cheese sauce. Milk  Recommended: Order low-fat or fat-free milk according to your meal plan. Plain, nonfat yogurt or flavored yogurt with no sugar added may be used as a substitute for milk. Soy milk  may also be used.  Avoid: Milk shakes or sweetened milk beverages. Soups and Combination Foods  Recommended: Clear broth or consomm are "free" foods and may be used as an appetizer. Broth-based soups with fat removed count as a starch serving and are preferred over cream soups. Soups made with beans or split peas may be eaten but count as a starch.  Avoid: Fatty soups, soup made with  cream, cheese soup. Combination foods prepared with excessive amounts of fat or with cream or cheese sauces. Desserts and Sweets  Recommended: Ask for fresh fruit. Sponge or angel food cake without icing, ice milk, no sugar added ice cream, sherbet, or frozen yogurt may fit into your meal plan occasionally.  Avoid: Pastries, puddings, pies, cakes with icing, custard, gelatin desserts. Fats and Oils  Recommended: Choose healthy fats such as olive oil, canola oil, or tub margarine, reduced fat or fat-free sour cream, cream cheese, avocado, or nuts.  Avoid: Any fats in excess of your allowed portion. Deep-fried foods or any food with a large amount of fat. Note: Ask for all fats to be served on the side, and limit your portion sizes according to your meal plan. Document Released: 02/11/2005 Document Revised: 05/06/2011 Document Reviewed: 09/01/2008 [facility] Patient Information 2013 [place], [place]. Diets for Diabetes, Food Labeling Look at food labels to help you decide how much of a product you can eat. You will want to check the amount of total carbohydrate in a serving to see how the food fits into your meal plan. In the list of ingredients, the ingredient present in the largest amount by weight must be listed first, followed by the other ingredients in descending order. STANDARD OF IDENTITY Most products have a list of ingredients. However, foods that the Food and Drug Administration (FDA) has given a standard of identity do not need a list of ingredients. A standard of identity means that a food must contain certain ingredients if it is called a particular name. Examples are mayonnaise, peanut butter, ketchup, jelly, and cheese. LABELING TERMS There are many terms found on food labels. Some of these terms have specific definitions. Some terms are regulated by the FDA, and the FDA has clearly specified how they can be used. Others are not regulated or well-defined and can be misleading and  confusing. SPECIFICALLY DEFINED TERMS Nutritive Sweetener.  A sweetener that contains calories,such as table sugar or honey. Nonnutritive Sweetener.  A sweetener with few or no calories,such as saccharin, aspartame, sucralose, and cyclamate. LABELING TERMS REGULATED BY THE FDA Free.  The product contains only a tiny or small amount of fat, cholesterol, sodium, sugar, or calories. For example, a "fat-free" product will contain less than 0.5 g of fat per serving. Low.  A food described as "low" in fat, saturated fat, cholesterol, sodium, or calories could be eaten fairly often without exceeding dietary guidelines. For example, "low in fat" means no more than 3 g of fat per serving. Lean.  "Lean" and "extra lean" are U.S. Department of Agriculture Architect) terms for use on meat and poultry products. "Lean" means the product contains less than 10 g of fat, 4 g of saturated fat, and 95 mg of cholesterol per serving. "Lean" is not as low in fat as a product labeled "low." Extra Lean.  "Extra lean" means the product contains less than 5 g of fat, 2 g of saturated fat, and 95 mg of cholesterol per serving. While "extra lean" has less fat than "lean," it is still higher  in fat than a product labeled "low." Reduced, Less, Fewer.  A diet product that contains 25% less of a nutrient or calories than the regular version. For example, hot dogs might be labeled "25% less fat than our regular hot dogs." Light/Lite.  A diet product that contains  fewer calories or  the fat of the original. For example, "light in sodium" means a product with  the usual sodium. More.  One serving contains at least 10% more of the daily value of a vitamin, mineral, or fiber than usual. Good Source Of.  One serving contains 10% to 19% of the daily value for a particular vitamin, mineral, or fiber. Excellent Source Of.  One serving contains 20% or more of the daily value for a particular nutrient. Other terms used might  be "high in" or "rich in." Enriched or Fortified.  The product contains added vitamins, minerals, or protein. Nutrition labeling must be used on enriched or fortified foods. Imitation.  The product has been altered so that it is lower in protein, vitamins, or minerals than the usual food,such as imitation peanut butter. Total Fat.  The number listed is the total of all fat found in a serving of the product. Under total fat, food labels must list saturated fat and trans fat, which are associated with raising bad cholesterol and an increased risk of heart blood vessel disease. Saturated Fat.  Mainly fats from animal-based sources. Some examples are red meat, cheese, cream, whole milk, and coconut oil. Trans Fat.  Found in some fried snack foods, packaged foods, and fried restaurant foods. It is recommended you eat as close to 0 g of trans fat as possible, since it raises bad cholesterol and lowers good cholesterol. Polyunsaturated and Monounsaturated Fats.  More healthful fats. These fats are from plant sources. Total Carbohydrate.  The number of carbohydrate grams in a serving of the product. Under total carbohydrate are listed the other carbohydrate sources, such as dietary fiber and sugars. Dietary Fiber.  A carbohydrate from plant sources. Sugars.  Sugars listed on the label contain all naturally occurring sugars as well as added sugars. LABELING TERMS NOT REGULATED BY THE FDA Sugarless.  Table sugar (sucrose) has not been added. However, the manufacturer may use another form of sugar in place of sucrose to sweeten the product. For example, sugar alcohols are used to sweeten foods. Sugar alcohols are a form of sugar but are not table sugar. If a product contains sugar alcohols in place of sucrose, it can still be labeled "sugarless." Low Salt, Salt-Free, Unsalted, No Salt, No Salt Added, Without Added Salt.  Food that is usually processed with salt has been made without salt.  However, the food may contain sodium-containing additives, such as preservatives, leavening agents, or flavorings. Natural.  This term has no legal meaning. Organic.  Foods that are certified as organic have been inspected and approved by the USDA to ensure they are produced without pesticides, fertilizers containing synthetic ingredients, bioengineering, or ionizing radiation. Document Released: 02/14/2003 Document Revised: 05/06/2011 Document Reviewed: 09/01/2008 [facility] Patient Information 2013 [place], [place].    Schedule your colonoscopy to help detect colon cancer.

## 2012-05-26 DIAGNOSIS — D260 Other benign neoplasm of cervix uteri: Secondary | ICD-10-CM | POA: Diagnosis not present

## 2012-05-26 DIAGNOSIS — R87610 Atypical squamous cells of undetermined significance on cytologic smear of cervix (ASC-US): Secondary | ICD-10-CM | POA: Diagnosis not present

## 2012-05-26 DIAGNOSIS — N72 Inflammatory disease of cervix uteri: Secondary | ICD-10-CM | POA: Diagnosis not present

## 2012-08-31 DIAGNOSIS — D429 Neoplasm of uncertain behavior of meninges, unspecified: Secondary | ICD-10-CM | POA: Diagnosis not present

## 2012-08-31 DIAGNOSIS — G40219 Localization-related (focal) (partial) symptomatic epilepsy and epileptic syndromes with complex partial seizures, intractable, without status epilepticus: Secondary | ICD-10-CM | POA: Diagnosis not present

## 2012-09-04 DIAGNOSIS — R220 Localized swelling, mass and lump, head: Secondary | ICD-10-CM | POA: Diagnosis not present

## 2012-09-04 DIAGNOSIS — R569 Unspecified convulsions: Secondary | ICD-10-CM | POA: Diagnosis not present

## 2012-09-04 DIAGNOSIS — F329 Major depressive disorder, single episode, unspecified: Secondary | ICD-10-CM | POA: Diagnosis not present

## 2012-09-04 DIAGNOSIS — R6889 Other general symptoms and signs: Secondary | ICD-10-CM | POA: Diagnosis not present

## 2012-09-04 DIAGNOSIS — D320 Benign neoplasm of cerebral meninges: Secondary | ICD-10-CM | POA: Diagnosis not present

## 2012-09-04 DIAGNOSIS — D429 Neoplasm of uncertain behavior of meninges, unspecified: Secondary | ICD-10-CM | POA: Diagnosis not present

## 2012-09-04 DIAGNOSIS — R221 Localized swelling, mass and lump, neck: Secondary | ICD-10-CM | POA: Diagnosis not present

## 2012-09-04 DIAGNOSIS — C700 Malignant neoplasm of cerebral meninges: Secondary | ICD-10-CM | POA: Diagnosis not present

## 2012-09-04 DIAGNOSIS — F3289 Other specified depressive episodes: Secondary | ICD-10-CM | POA: Diagnosis not present

## 2012-09-21 DIAGNOSIS — F09 Unspecified mental disorder due to known physiological condition: Secondary | ICD-10-CM | POA: Insufficient documentation

## 2012-09-21 DIAGNOSIS — D429 Neoplasm of uncertain behavior of meninges, unspecified: Secondary | ICD-10-CM | POA: Diagnosis not present

## 2012-09-21 DIAGNOSIS — F339 Major depressive disorder, recurrent, unspecified: Secondary | ICD-10-CM | POA: Insufficient documentation

## 2012-09-21 DIAGNOSIS — F341 Dysthymic disorder: Secondary | ICD-10-CM | POA: Diagnosis not present

## 2012-10-07 DIAGNOSIS — F341 Dysthymic disorder: Secondary | ICD-10-CM | POA: Diagnosis not present

## 2012-10-07 DIAGNOSIS — D429 Neoplasm of uncertain behavior of meninges, unspecified: Secondary | ICD-10-CM | POA: Diagnosis not present

## 2012-10-07 DIAGNOSIS — F339 Major depressive disorder, recurrent, unspecified: Secondary | ICD-10-CM | POA: Diagnosis not present

## 2012-10-07 DIAGNOSIS — F09 Unspecified mental disorder due to known physiological condition: Secondary | ICD-10-CM | POA: Diagnosis not present

## 2012-12-03 DIAGNOSIS — Z124 Encounter for screening for malignant neoplasm of cervix: Secondary | ICD-10-CM | POA: Diagnosis not present

## 2012-12-03 DIAGNOSIS — R87610 Atypical squamous cells of undetermined significance on cytologic smear of cervix (ASC-US): Secondary | ICD-10-CM | POA: Diagnosis not present

## 2013-01-08 DIAGNOSIS — C700 Malignant neoplasm of cerebral meninges: Secondary | ICD-10-CM | POA: Diagnosis not present

## 2013-01-14 DIAGNOSIS — D320 Benign neoplasm of cerebral meninges: Secondary | ICD-10-CM | POA: Diagnosis not present

## 2013-01-14 DIAGNOSIS — C700 Malignant neoplasm of cerebral meninges: Secondary | ICD-10-CM | POA: Diagnosis not present

## 2013-01-25 DIAGNOSIS — C700 Malignant neoplasm of cerebral meninges: Secondary | ICD-10-CM | POA: Diagnosis not present

## 2013-01-25 DIAGNOSIS — D320 Benign neoplasm of cerebral meninges: Secondary | ICD-10-CM | POA: Diagnosis not present

## 2013-01-26 DIAGNOSIS — C700 Malignant neoplasm of cerebral meninges: Secondary | ICD-10-CM | POA: Diagnosis not present

## 2013-01-26 DIAGNOSIS — F09 Unspecified mental disorder due to known physiological condition: Secondary | ICD-10-CM | POA: Diagnosis not present

## 2013-01-26 DIAGNOSIS — F339 Major depressive disorder, recurrent, unspecified: Secondary | ICD-10-CM | POA: Diagnosis not present

## 2013-01-26 DIAGNOSIS — D429 Neoplasm of uncertain behavior of meninges, unspecified: Secondary | ICD-10-CM | POA: Diagnosis not present

## 2013-01-26 DIAGNOSIS — D496 Neoplasm of unspecified behavior of brain: Secondary | ICD-10-CM | POA: Diagnosis not present

## 2013-01-26 DIAGNOSIS — F341 Dysthymic disorder: Secondary | ICD-10-CM | POA: Diagnosis not present

## 2013-03-01 ENCOUNTER — Other Ambulatory Visit: Payer: Self-pay

## 2013-03-01 DIAGNOSIS — Z1231 Encounter for screening mammogram for malignant neoplasm of breast: Secondary | ICD-10-CM

## 2013-03-08 DIAGNOSIS — G40219 Localization-related (focal) (partial) symptomatic epilepsy and epileptic syndromes with complex partial seizures, intractable, without status epilepticus: Secondary | ICD-10-CM | POA: Diagnosis not present

## 2013-03-08 DIAGNOSIS — F339 Major depressive disorder, recurrent, unspecified: Secondary | ICD-10-CM | POA: Diagnosis not present

## 2013-03-08 DIAGNOSIS — D429 Neoplasm of uncertain behavior of meninges, unspecified: Secondary | ICD-10-CM | POA: Diagnosis not present

## 2013-03-16 DIAGNOSIS — F341 Dysthymic disorder: Secondary | ICD-10-CM | POA: Diagnosis not present

## 2013-03-16 DIAGNOSIS — F09 Unspecified mental disorder due to known physiological condition: Secondary | ICD-10-CM | POA: Diagnosis not present

## 2013-03-16 DIAGNOSIS — D429 Neoplasm of uncertain behavior of meninges, unspecified: Secondary | ICD-10-CM | POA: Diagnosis not present

## 2013-03-16 DIAGNOSIS — F339 Major depressive disorder, recurrent, unspecified: Secondary | ICD-10-CM | POA: Diagnosis not present

## 2013-03-25 ENCOUNTER — Ambulatory Visit
Admission: RE | Admit: 2013-03-25 | Discharge: 2013-03-25 | Disposition: A | Discharge status: Home / Self Care | Payer: Medicare Other | Source: Ambulatory Visit

## 2013-03-25 DIAGNOSIS — Z1231 Encounter for screening mammogram for malignant neoplasm of breast: Secondary | ICD-10-CM | POA: Diagnosis not present

## 2013-04-06 ENCOUNTER — Other Ambulatory Visit: Payer: Self-pay | Admitting: Obstetrics and Gynecology

## 2013-04-06 DIAGNOSIS — R928 Other abnormal and inconclusive findings on diagnostic imaging of breast: Secondary | ICD-10-CM

## 2013-04-15 ENCOUNTER — Ambulatory Visit
Admission: RE | Admit: 2013-04-15 | Discharge: 2013-04-15 | Disposition: A | Discharge status: Home / Self Care | Payer: Medicare Other | Source: Ambulatory Visit | Attending: Obstetrics and Gynecology | Admitting: Obstetrics and Gynecology

## 2013-04-15 DIAGNOSIS — R928 Other abnormal and inconclusive findings on diagnostic imaging of breast: Secondary | ICD-10-CM

## 2013-06-10 DIAGNOSIS — IMO0002 Reserved for concepts with insufficient information to code with codable children: Secondary | ICD-10-CM | POA: Diagnosis not present

## 2013-06-10 DIAGNOSIS — N952 Postmenopausal atrophic vaginitis: Secondary | ICD-10-CM | POA: Diagnosis not present

## 2013-06-10 DIAGNOSIS — Z01419 Encounter for gynecological examination (general) (routine) without abnormal findings: Secondary | ICD-10-CM | POA: Diagnosis not present

## 2013-09-03 DIAGNOSIS — C700 Malignant neoplasm of cerebral meninges: Secondary | ICD-10-CM | POA: Diagnosis not present

## 2013-09-03 DIAGNOSIS — D320 Benign neoplasm of cerebral meninges: Secondary | ICD-10-CM | POA: Diagnosis not present

## 2014-03-21 DIAGNOSIS — D429 Neoplasm of uncertain behavior of meninges, unspecified: Secondary | ICD-10-CM | POA: Diagnosis not present

## 2014-03-21 DIAGNOSIS — F341 Dysthymic disorder: Secondary | ICD-10-CM | POA: Diagnosis not present

## 2014-03-21 DIAGNOSIS — G40219 Localization-related (focal) (partial) symptomatic epilepsy and epileptic syndromes with complex partial seizures, intractable, without status epilepticus: Secondary | ICD-10-CM | POA: Diagnosis not present

## 2014-05-07 ENCOUNTER — Emergency Department (HOSPITAL_COMMUNITY)
Admission: EM | Admit: 2014-05-07 | Discharge: 2014-05-07 | Disposition: A | Discharge status: Home / Self Care | Payer: Medicare Other | Attending: Emergency Medicine | Admitting: Emergency Medicine

## 2014-05-07 ENCOUNTER — Encounter (HOSPITAL_COMMUNITY): Payer: Self-pay | Admitting: Emergency Medicine

## 2014-05-07 DIAGNOSIS — Z79899 Other long term (current) drug therapy: Secondary | ICD-10-CM | POA: Diagnosis not present

## 2014-05-07 DIAGNOSIS — Z23 Encounter for immunization: Secondary | ICD-10-CM | POA: Insufficient documentation

## 2014-05-07 DIAGNOSIS — S0502XA Injury of conjunctiva and corneal abrasion without foreign body, left eye, initial encounter: Secondary | ICD-10-CM | POA: Diagnosis not present

## 2014-05-07 DIAGNOSIS — H18822 Corneal disorder due to contact lens, left eye: Secondary | ICD-10-CM

## 2014-05-07 DIAGNOSIS — H578 Other specified disorders of eye and adnexa: Secondary | ICD-10-CM | POA: Diagnosis present

## 2014-05-07 MED ORDER — TETRACAINE HCL 0.5 % OP SOLN
1.0000 [drp] | Freq: Once | OPHTHALMIC | Status: AC
  Administered 2014-05-07: 1 [drp] via OPHTHALMIC
  Filled 2014-05-07: qty 2

## 2014-05-07 MED ORDER — MOXIFLOXACIN HCL 0.5 % OP SOLN: 1.0000 [drp] | Freq: Three times a day (TID) | OPHTHALMIC | Status: DC

## 2014-05-07 MED ORDER — FLUORESCEIN SODIUM 1 MG OP STRP
1.0000 | ORAL_STRIP | Freq: Once | OPHTHALMIC | Status: AC
  Administered 2014-05-07: 1 via OPHTHALMIC
  Filled 2014-05-07: qty 1

## 2014-05-07 MED ORDER — TETANUS-DIPHTH-ACELL PERTUSSIS 5-2.5-18.5 LF-MCG/0.5 IM SUSP
0.5000 mL | Freq: Once | INTRAMUSCULAR | Status: AC
  Administered 2014-05-07: 0.5 mL via INTRAMUSCULAR
  Filled 2014-05-07: qty 0.5

## 2014-05-07 NOTE — ED Notes (Signed)
Pt. Refused to do visual acuity screening test, stated she can not read at all because she does not have her contacts ON.

## 2014-05-07 NOTE — Discharge Instructions (Signed)
Please follow the directions provided.  Be sure to follow-up with the ophthalmologist Monday for an appointment. Use your drops as directed.  You may take ibuprofen for pain. Don't wear your contact lenses.  Don't hesitate to return for any new, worsening or concerning symptoms.    SEEK MEDICAL CARE IF:  You have pain, light sensitivity, and a scratchy feeling in one eye or both eyes.  Your pressure patch keeps loosening up, and you can blink your eye under the patch after treatment.  Any kind of discharge develops from the eye after treatment or if the lids stick together in the morning.  You have the same symptoms in the morning as you did with the original abrasion days, weeks, or months after the abrasion healed.

## 2014-05-07 NOTE — ED Notes (Addendum)
Pt reports left eye irritation without drainage. Says it "could be because my contact lenses irritated it. I also have a headache. I'm having pain right underneath my eye." No gross redness noted. Minimal swelling noted. Has been using "antibiotic cream." No other c/c. Neurologically intact. No unilateral weakness.

## 2014-05-07 NOTE — ED Provider Notes (Signed)
CSN: [id number]     Arrival date & time 05/07/14  2059 History   First MD Initiated Contact with Patient 05/07/14 2113     This chart was scribed for non-physician practitioner, Britt Bottom, NP working with Leonard Schwartz, MD by Forrestine Him, ED Scribe. This patient was seen in room WTR7/WTR7 and the patient's care was started at 9:34 PM.   Chief Complaint  Patient presents with  . Headache  . Eye irritation    The history is provided by the patient. No language interpreter was used.    HPI Comments: Vickie Gross is a 64 y.o. female who presents to the Emergency Department complaining of L eye irritation described as "discomfort" with associated redness x 1 day. She also reports some discomfort to skin surround her L eye and a mild HA. Currently pain rated 3-4/10. No drainage noted to eye. Prior to symptoms, pt admits to wearing her contacts for a long period of time. Pt has tried OTC "antibiotic eye cream" without any improvement to symptoms. No recent fever or chills. Pt is due for a Tetanus shot.  History reviewed. No pertinent past medical history. History reviewed. No pertinent past surgical history. History reviewed. No pertinent family history. History  Substance Use Topics  . Smoking status: Never Smoker   . Smokeless tobacco: Never Used  . Alcohol Use: No   OB History    No data available     Review of Systems  Eyes: Positive for pain and redness. Negative for photophobia, discharge, itching and visual disturbance.  Neurological: Positive for headaches. Negative for weakness, light-headedness and numbness.      Allergies  Review of patient's allergies indicates no known allergies.  Home Medications   Prior to Admission medications   Medication Sig Start Date End Date Taking? Authorizing Provider  hydroxypropyl methylcellulose / hypromellose (ISOPTO TEARS / GONIOVISC) 2.5 % ophthalmic solution Place 1 drop into both eyes 3 (three) times daily as needed for dry  eyes.   Yes Historical Provider, MD  lamoTRIgine (LAMICTAL) 150 MG tablet Take 150 mg by mouth 2 (two) times daily.  03/21/14  Yes Historical Provider, MD  Levetiracetam 750 MG TB24 Take 750 mg by mouth 2 (two) times daily.  03/21/14 03/22/15 Yes Historical Provider, MD   Triage Vitals: BP 108/49 mmHg  Pulse 55  Temp(Src) 97.8 F (36.6 C) (Oral)  Resp 18  SpO2 100%  LMP 03/11/1994   Physical Exam  Constitutional: She is oriented to person, place, and time. She appears well-developed and well-nourished.  HENT:  Head: Normocephalic.  Eyes: EOM are normal. Pupils are equal, round, and reactive to light. Left eye exhibits discharge. Right conjunctiva is not injected. Left conjunctiva is injected.  Slit lamp exam:      The left eye shows corneal abrasion and fluorescein uptake. The left eye shows no corneal ulcer.  Corneal abrasion to 6 oclock position of her L eye  Neck: Normal range of motion. Neck supple.  Cardiovascular: Normal rate and regular rhythm.   Pulmonary/Chest: Effort normal. No respiratory distress.  Musculoskeletal: Normal range of motion.  Neurological: She is alert and oriented to person, place, and time. She has normal strength. No cranial nerve deficit or sensory deficit. GCS eye subscore is 4. GCS verbal subscore is 5. GCS motor subscore is 6.  Cranial nerves 2-12  Skin: Skin is warm and dry.  Psychiatric: She has a normal mood and affect.  Nursing note and vitals reviewed.   ED Course  Procedures (  including critical care time)  DIAGNOSTIC STUDIES: Oxygen Saturation is 100% on RA, Normal by my interpretation.    COORDINATION OF CARE: 9:46 PM-Discussed treatment plan with pt at bedside and pt agreed to plan.     Labs Review Labs Reviewed - No data to display  Imaging Review No results found.   EKG Interpretation None      MDM   Final diagnoses:  Corneal abrasion due to contact lens, left   64 yo with redness, pain and discharge extended period of  wearing her contacts.  Eye irrigated w NS, no evidence of FB.  Pt declines visual acuity because she cannot see without her contacts. Tdap given.  Corneal abrasion noted on exam but no corneal ulcer noted on slit lamp exam.  No indication of orbital cellulitis, or hyphema, Discussed findings with pt. Will be discharged with Vigimox drops and referral to follow-up with ophthalmology. She continues to decline pain meds.  Return precautions provided. Pt and family aware of plan and in agreement.    I personally performed the services described in this documentation, which was scribed in my presence. The recorded information has been reviewed and is accurate.  Filed Vitals:   05/07/14 2109 05/07/14 2237  BP: 108/49 101/70  Pulse: 55 61  Temp: 97.8 F (36.6 C)   TempSrc: Oral   Resp: 18   SpO2: 100% 97%   Meds given in ED:  Medications  tetracaine (PONTOCAINE) 0.5 % ophthalmic solution 1 drop (1 drop Left Eye Given by Other 05/07/14 2225)  fluorescein ophthalmic strip 1 strip (1 strip Both Eyes Given by Other 05/07/14 2225)  Tdap (BOOSTRIX) injection 0.5 mL (0.5 mLs Intramuscular Given 05/07/14 2207)    Discharge Medication List as of 05/07/2014 10:36 PM    START taking these medications   Details  moxifloxacin (VIGAMOX) 0.5 % ophthalmic solution Place 1 drop into the left eye 3 (three) times daily. Place one drop in left eye every 2 hours while awake for 2 days. Then every 4 hours while awake for 5 days., Starting 05/07/2014, Until Discontinued, Print         Britt Bottom, NP 05/08/14 1650  Leonard Schwartz, MD 05/12/14 [phone]

## 2014-06-06 ENCOUNTER — Other Ambulatory Visit: Payer: Self-pay

## 2014-06-06 DIAGNOSIS — Z1231 Encounter for screening mammogram for malignant neoplasm of breast: Secondary | ICD-10-CM

## 2014-06-09 ENCOUNTER — Encounter (INDEPENDENT_AMBULATORY_CARE_PROVIDER_SITE_OTHER): Payer: Self-pay

## 2014-06-09 ENCOUNTER — Ambulatory Visit
Admission: RE | Admit: 2014-06-09 | Discharge: 2014-06-09 | Disposition: A | Discharge status: Home / Self Care | Payer: Medicare Other | Source: Ambulatory Visit

## 2014-06-09 DIAGNOSIS — Z1231 Encounter for screening mammogram for malignant neoplasm of breast: Secondary | ICD-10-CM | POA: Diagnosis not present

## 2014-06-13 ENCOUNTER — Ambulatory Visit: Payer: Medicare Other

## 2014-06-17 DIAGNOSIS — R87610 Atypical squamous cells of undetermined significance on cytologic smear of cervix (ASC-US): Secondary | ICD-10-CM | POA: Diagnosis not present

## 2014-09-02 DIAGNOSIS — F341 Dysthymic disorder: Secondary | ICD-10-CM | POA: Diagnosis not present

## 2014-09-02 DIAGNOSIS — G9389 Other specified disorders of brain: Secondary | ICD-10-CM | POA: Diagnosis not present

## 2014-09-02 DIAGNOSIS — D429 Neoplasm of uncertain behavior of meninges, unspecified: Secondary | ICD-10-CM | POA: Diagnosis not present

## 2014-09-02 DIAGNOSIS — Z79899 Other long term (current) drug therapy: Secondary | ICD-10-CM | POA: Diagnosis not present

## 2014-09-02 DIAGNOSIS — C700 Malignant neoplasm of cerebral meninges: Secondary | ICD-10-CM | POA: Diagnosis not present

## 2014-09-02 DIAGNOSIS — D329 Benign neoplasm of meninges, unspecified: Secondary | ICD-10-CM | POA: Diagnosis not present

## 2014-09-20 ENCOUNTER — Emergency Department (HOSPITAL_COMMUNITY)
Admission: EM | Admit: 2014-09-20 | Discharge: 2014-09-20 | Disposition: A | Discharge status: Home / Self Care | Payer: Medicare Other | Attending: Physician Assistant | Admitting: Physician Assistant

## 2014-09-20 ENCOUNTER — Encounter (HOSPITAL_COMMUNITY): Payer: Self-pay

## 2014-09-20 DIAGNOSIS — H1132 Conjunctival hemorrhage, left eye: Secondary | ICD-10-CM | POA: Insufficient documentation

## 2014-09-20 DIAGNOSIS — Z79899 Other long term (current) drug therapy: Secondary | ICD-10-CM | POA: Insufficient documentation

## 2014-09-20 DIAGNOSIS — H579 Unspecified disorder of eye and adnexa: Secondary | ICD-10-CM | POA: Diagnosis present

## 2014-09-20 MED ORDER — SODIUM CHLORIDE 0.9 % IJ SOLN
INTRAMUSCULAR | Status: AC
  Filled 2014-09-20: qty 10

## 2014-09-20 MED ORDER — TETRACAINE HCL 0.5 % OP SOLN
1.0000 [drp] | Freq: Once | OPHTHALMIC | Status: AC
  Administered 2014-09-20: 1 [drp] via OPHTHALMIC
  Filled 2014-09-20: qty 2

## 2014-09-20 NOTE — ED Notes (Addendum)
Pt presents with c/o eye redness that started around 5 pm today. Pt denies any injury to that eye, denies drainage as well. Pt also c/o feeling some pressure behind that eye, denies any blurred vision.

## 2014-09-20 NOTE — ED Notes (Signed)
TOPEN AT BEDSIDE, [place]

## 2014-09-20 NOTE — Discharge Instructions (Signed)
Subconjunctival Hemorrhage Your exam shows you have a subconjunctival hemorrhage. This is a harmless collection of blood covering a portion of the white of the eye. This condition may be due to injury or to straining (lifting, sneezing, or coughing). Often, there is no known cause. Subconjunctival blood does not cause pain or vision problems. This condition needs no treatment. It will take 1 to 2 weeks for the blood to dissolve. If you take aspirin or Coumadin on a daily basis or if you have high blood pressure, you should check with your doctor about the need for further treatment. Please call your doctor if you have problems with your vision, pain around the eye, or any other concerns about your condition. Document Released: 03/21/2004 Document Revised: 05/06/2011 Document Reviewed: 01/09/2009 [facility] Patient Information 2015 [place], [place]. This information is not intended to replace advice given to you by your health care provider. Make sure you discuss any questions you have with your health care provider.  Subconjunctival Hemorrhage A subconjunctival hemorrhage is a bright red patch covering a portion of the white of the eye. The white part of the eye is called the sclera, and it is covered by a thin membrane called the conjunctiva. This membrane is clear, except for tiny blood vessels that you can see with the naked eye. When your eye is irritated or inflamed and becomes red, it is because the vessels in the conjunctiva are swollen. Sometimes, a blood vessel in the conjunctiva can break and bleed. When this occurs, the blood builds up between the conjunctiva and the sclera, and spreads out to create a red area. The red spot may be very small at first. It may then spread to cover a larger part of the surface of the eye, or even all of the visible white part of the eye. In almost all cases, the blood will go away and the eye will become white again. Before completely dissolving, however, the red area  may spread. It may also become brownish-yellow in color before going away. If a lot of blood collects under the conjunctiva, it may look like a bulge on the surface of the eye. This looks scary, but it will also eventually flatten out and go away. Subconjunctival hemorrhages do not cause pain, but if swollen, may cause a feeling of irritation. There is no effect on vision.  CAUSES   The most common cause is mild trauma (rubbing the eye, irritation).  Subconjunctival hemorrhages can happen because of coughing or straining (lifting heavy objects), vomiting, or sneezing.  In some cases, your doctor may want to check your blood pressure. High blood pressure can also cause a subconjunctival hemorrhage.  Severe trauma or blunt injuries.  Diseases that affect blood clotting (hemophilia, leukemia).  Abnormalities of blood vessels behind the eye (carotid cavernous sinus fistula).  Tumors behind the eye.  Certain drugs (aspirin, Coumadin, heparin).  Recent eye surgery. HOME CARE INSTRUCTIONS   Do not worry about the appearance of your eye. You may continue your usual activities.  Often, follow-up is not necessary. SEEK MEDICAL CARE IF:   Your eye becomes painful.  The bleeding does not disappear within 3 weeks.  Bleeding occurs elsewhere, for example, under the skin, in the mouth, or in the other eye.  You have recurring subconjunctival hemorrhages. SEEK IMMEDIATE MEDICAL CARE IF:   Your vision changes or you have difficulty seeing.  You develop a severe headache, persistent vomiting, confusion, or abnormal drowsiness (lethargy).  Your eye seems to bulge or  protrude from the eye socket.  You notice the sudden appearance of bruises or have spontaneous bleeding elsewhere on your body. Document Released: 02/11/2005 Document Revised: 06/28/2013 Document Reviewed: 01/09/2009 [facility] Patient Information 2015 [place], [place]. This information is not intended to replace advice given to you  by your health care provider. Make sure you discuss any questions you have with your health care provider.

## 2014-09-20 NOTE — ED Provider Notes (Signed)
CSN: [id number]     Arrival date & time 09/20/14  2002 History   First MD Initiated Contact with Patient 09/20/14 2225     Chief Complaint  Patient presents with  . Eye Problem     (Consider location/radiation/quality/duration/timing/severity/associated sxs/prior Treatment) HPI  Patient here with redness to her left eye. Patient denies any sneezing vomiting or effort and stooling recently. Patient says she is a little bit of pain in her left eye and then 20 minutes later reported also some pain in her right eye. She is here with husband. Patient denies any trouble seeing. No pain with extra ocular movement. Headaches.   History reviewed. No pertinent past medical history. History reviewed. No pertinent past surgical history. No family history on file. History  Substance Use Topics  . Smoking status: Never Smoker   . Smokeless tobacco: Never Used  . Alcohol Use: No   OB History    No data available     Review of Systems  Constitutional: Negative for activity change.  Respiratory: Negative for shortness of breath.   Cardiovascular: Negative for chest pain.  Gastrointestinal: Negative for abdominal pain.      Allergies  Review of patient's allergies indicates no known allergies.  Home Medications   Prior to Admission medications   Medication Sig Start Date End Date Taking? Authorizing Provider  hydroxypropyl methylcellulose / hypromellose (ISOPTO TEARS / GONIOVISC) 2.5 % ophthalmic solution Place 1 drop into both eyes 3 (three) times daily as needed for dry eyes.    Historical Provider, MD  lamoTRIgine (LAMICTAL) 150 MG tablet Take 150 mg by mouth 2 (two) times daily.  03/21/14   Historical Provider, MD  Levetiracetam 750 MG TB24 Take 750 mg by mouth 2 (two) times daily.  03/21/14 03/22/15  Historical Provider, MD  moxifloxacin (VIGAMOX) 0.5 % ophthalmic solution Place 1 drop into the left eye 3 (three) times daily. Place one drop in left eye every 2 hours while awake for 2  days. Then every 4 hours while awake for 5 days. 05/07/14   Britt Bottom, NP   BP 98/61 mmHg  Pulse 65  Temp(Src) 98.1 F (36.7 C) (Oral)  Resp 18  SpO2 98%  LMP 03/11/1994 Physical Exam  Constitutional: She is oriented to person, place, and time. She appears well-developed and well-nourished.  HENT:  Head: Normocephalic and atraumatic.  Eyes:  No discharge. Subconjunctival hemorrhage on the left. Does not completely surround. No tenderness to palpation. Eyeball soft. EOMI movements intact.  Pulmonary/Chest: Effort normal.  Musculoskeletal: Normal range of motion.  Neurological: She is oriented to person, place, and time. No cranial nerve deficit.  Skin: Skin is warm and dry. She is not diaphoretic.  Psychiatric: Her behavior is normal.  Nursing note and vitals reviewed.   ED Course  Procedures (including critical care time) Labs Review Labs Reviewed - No data to display  Imaging Review No results found.   EKG Interpretation None      MDM   Final diagnoses:  None   patient is 64 year old female presenting with subconjunctival hemorrhage on the left. This appears completely consistent with subconjunctival hemorrhage. She no preceding trauma or sneezing etc. However patient is very anxious and is going up there is some eye drops that would help make sure that he gets better sooner. Will use Tono-Pen with tetracaine to make sure there is no increased IOP. With  then advised patient to follow up with her primary care provider as needed. Precautions were given. In addition  patient was informed that there'll be a change of color as the blood is resolved.  Patient initially refused tetracaine.  Then changed her mind.  tono pen pressure 10 in L eye.   Harwood Nall Julio Alm, MD 09/20/14 [phone]

## 2014-12-31 DIAGNOSIS — Z23 Encounter for immunization: Secondary | ICD-10-CM | POA: Diagnosis not present

## 2015-02-28 ENCOUNTER — Encounter (HOSPITAL_COMMUNITY): Payer: Self-pay | Admitting: Emergency Medicine

## 2015-02-28 ENCOUNTER — Emergency Department (HOSPITAL_COMMUNITY): Payer: Medicare Other

## 2015-02-28 ENCOUNTER — Emergency Department (HOSPITAL_COMMUNITY)
Admission: EM | Admit: 2015-02-28 | Discharge: 2015-02-28 | Disposition: A | Discharge status: Home / Self Care | Payer: Medicare Other | Attending: Emergency Medicine | Admitting: Emergency Medicine

## 2015-02-28 DIAGNOSIS — R42 Dizziness and giddiness: Secondary | ICD-10-CM | POA: Insufficient documentation

## 2015-02-28 DIAGNOSIS — R270 Ataxia, unspecified: Secondary | ICD-10-CM | POA: Diagnosis not present

## 2015-02-28 DIAGNOSIS — Z79899 Other long term (current) drug therapy: Secondary | ICD-10-CM | POA: Insufficient documentation

## 2015-02-28 DIAGNOSIS — R404 Transient alteration of awareness: Secondary | ICD-10-CM | POA: Diagnosis not present

## 2015-02-28 HISTORY — DX: Neoplasm of unspecified behavior of brain: D49.6

## 2015-02-28 LAB — COMPREHENSIVE METABOLIC PANEL
ALK PHOS: 47 U/L (ref 38–126)
ALT: 15 U/L (ref 14–54)
AST: 26 U/L (ref 15–41)
Albumin: 4.3 g/dL (ref 3.5–5.0)
Anion gap: 10 (ref 5–15)
BILIRUBIN TOTAL: 0.6 mg/dL (ref 0.3–1.2)
BUN: 10 mg/dL (ref 6–20)
CO2: 24 mmol/L (ref 22–32)
CREATININE: 0.93 mg/dL (ref 0.44–1.00)
Calcium: 9.7 mg/dL (ref 8.9–10.3)
Chloride: 106 mmol/L (ref 101–111)
GFR calc Af Amer: >60 mL/min (ref >60)
Glucose, Bld: 79 mg/dL (ref 65–99)
Potassium: 4.3 mmol/L (ref 3.5–5.1)
Sodium: 140 mmol/L (ref 135–145)
TOTAL PROTEIN: 7.3 g/dL (ref 6.5–8.1)

## 2015-02-28 LAB — CBC
HCT: 39.5 % (ref 36.0–46.0)
Hemoglobin: 13.3 g/dL (ref 12.0–15.0)
MCH: 29.2 pg (ref 26.0–34.0)
MCHC: 33.7 g/dL (ref 30.0–36.0)
MCV: 86.6 fL (ref 78.0–100.0)
PLATELETS: 167 10*3/uL (ref 150–400)
RBC: 4.56 MIL/uL (ref 3.87–5.11)
RDW: 13.7 % (ref 11.5–15.5)
WBC: 5.1 10*3/uL (ref 4.0–10.5)

## 2015-02-28 LAB — URINALYSIS, ROUTINE W REFLEX MICROSCOPIC
Bilirubin Urine: NEGATIVE
Glucose, UA: NEGATIVE mg/dL
HGB URINE DIPSTICK: NEGATIVE
Ketones, ur: NEGATIVE mg/dL
LEUKOCYTES UA: NEGATIVE
Nitrite: NEGATIVE
PROTEIN: NEGATIVE mg/dL
Specific Gravity, Urine: 1.011 (ref 1.005–1.030)
pH: 8 (ref 5.0–8.0)

## 2015-02-28 LAB — PROTIME-INR
INR: 1.12 (ref 0.00–1.49)
Prothrombin Time: 14.6 seconds (ref 11.6–15.2)

## 2015-02-28 LAB — DIFFERENTIAL
Basophils Absolute: 0 10*3/uL (ref 0.0–0.1)
Basophils Relative: 0 %
Eosinophils Absolute: 0 10*3/uL (ref 0.0–0.7)
Eosinophils Relative: 0 %
LYMPHS ABS: 1.3 10*3/uL (ref 0.7–4.0)
Lymphocytes Relative: 25 %
MONOS PCT: 6 %
Monocytes Absolute: 0.3 10*3/uL (ref 0.1–1.0)
NEUTROS ABS: 3.5 10*3/uL (ref 1.7–7.7)
NEUTROS PCT: 69 %

## 2015-02-28 LAB — I-STAT TROPONIN, ED: TROPONIN I, POC: 0 ng/mL (ref 0.00–0.08)

## 2015-02-28 LAB — APTT: aPTT: 23 seconds — ABNORMAL LOW (ref 24–37)

## 2015-02-28 LAB — RAPID URINE DRUG SCREEN, HOSP PERFORMED
Amphetamines: NOT DETECTED
BARBITURATES: NOT DETECTED
Benzodiazepines: NOT DETECTED
Cocaine: NOT DETECTED
Opiates: NOT DETECTED
Tetrahydrocannabinol: NOT DETECTED

## 2015-02-28 LAB — ETHANOL: Alcohol, Ethyl (B): <5 mg/dL (ref <5)

## 2015-02-28 MED ORDER — MECLIZINE HCL 25 MG PO TABS
25.0000 mg | ORAL_TABLET | Freq: Once | ORAL | Status: AC
  Administered 2015-02-28: 25 mg via ORAL
  Filled 2015-02-28: qty 1

## 2015-02-28 MED ORDER — SODIUM CHLORIDE 0.9 % IV BOLUS (SEPSIS)
1000.0000 mL | Freq: Once | INTRAVENOUS | Status: AC
Start: 2015-02-28 — End: 2015-02-28
  Administered 2015-02-28: 1000 mL via INTRAVENOUS

## 2015-02-28 MED ORDER — DIAZEPAM 5 MG/ML IJ SOLN: 5.0000 mg | Freq: Once | INTRAMUSCULAR | Status: DC

## 2015-02-28 MED ORDER — MECLIZINE HCL 25 MG PO TABS: 25.0000 mg | ORAL_TABLET | Freq: Three times a day (TID) | ORAL | Status: DC | PRN

## 2015-02-28 NOTE — ED Notes (Signed)
MD advised, patient assistant to the bedside command. Patient a two person assist.

## 2015-02-28 NOTE — ED Notes (Signed)
Spoke with lab states will add Lamotrigine to Labs already sent.

## 2015-02-28 NOTE — Discharge Instructions (Signed)
He were seen today for your dizziness and inability to walk. At this time it does not appear that he had a stroke. The neurology team does recommend that he take an 81 mg aspirin which is the baby aspirin once daily. Use the Antivert medication which will help to control vertigo as needed. Keep your follow-up appointment with your neurologist at [facility].  Vertigo Vertigo means you feel like you or your surroundings are moving when they are not. Vertigo can be dangerous if it occurs when you are at work, driving, or performing difficult activities.  CAUSES  Vertigo occurs when there is a conflict of signals sent to your brain from the visual and sensory systems in your body. There are many different causes of vertigo, including:  Infections, especially in the inner ear.  A bad reaction to a drug or misuse of alcohol and medicines.  Withdrawal from drugs or alcohol.  Rapidly changing positions, such as lying down or rolling over in bed.  A migraine headache.  Decreased blood flow to the brain.  Increased pressure in the brain from a head injury, infection, tumor, or bleeding. SYMPTOMS  You may feel as though the world is spinning around or you are falling to the ground. Because your balance is upset, vertigo can cause nausea and vomiting. You may have involuntary eye movements (nystagmus). DIAGNOSIS  Vertigo is usually diagnosed by physical exam. If the cause of your vertigo is unknown, your caregiver may perform imaging tests, such as an MRI scan (magnetic resonance imaging). TREATMENT  Most cases of vertigo resolve on their own, without treatment. Depending on the cause, your caregiver may prescribe certain medicines. If your vertigo is related to body position issues, your caregiver may recommend movements or procedures to correct the problem. In rare cases, if your vertigo is caused by certain inner ear problems, you may need surgery. HOME CARE INSTRUCTIONS   Follow your caregiver's  instructions.  Avoid driving.  Avoid operating heavy machinery.  Avoid performing any tasks that would be dangerous to you or others during a vertigo episode.  Tell your caregiver if you notice that certain medicines seem to be causing your vertigo. Some of the medicines used to treat vertigo episodes can actually make them worse in some people. SEEK IMMEDIATE MEDICAL CARE IF:   Your medicines do not relieve your vertigo or are making it worse.  You develop problems with talking, walking, weakness, or using your arms, hands, or legs.  You develop severe headaches.  Your nausea or vomiting continues or gets worse.  You develop visual changes.  A family member notices behavioral changes.  Your condition gets worse. MAKE SURE YOU:  Understand these instructions.  Will watch your condition.  Will get help right away if you are not doing well or get worse.   This information is not intended to replace advice given to you by your health care provider. Make sure you discuss any questions you have with your health care provider.   Document Released: 11/21/2004 Document Revised: 05/06/2011 Document Reviewed: 06/06/2014 Elsevier Interactive Patient Education [employer].

## 2015-02-28 NOTE — ED Notes (Signed)
Attempted to ambulate patient to restroom. Patient unable to bear own weight.

## 2015-02-28 NOTE — ED Notes (Signed)
Vickie Gross advised unable to collect blood with lab draw

## 2015-02-28 NOTE — ED Provider Notes (Signed)
CSN: [id number]     Arrival date & time 02/28/15  [id number] History   First MD Initiated Contact with Patient 02/28/15 [phone]     Chief Complaint  Patient presents with  . Dizziness     (Consider location/radiation/quality/duration/timing/severity/associated sxs/prior Treatment) HPI Comments: 65 year old female with history of recurrent meningiomas status post resection presents for feeling like her head is swimming not been able to walk. The patient reports that around 3 PM yesterday afternoon she was reading when suddenly she felt like her head was swimming. She says that she has had this sensation in the past but usually it resolves quickly the last time was a couple months ago. She said that she went to bed last night hoping that if she slept she would feel better in the morning. She reports that the symptoms are actually worse this morning and she was unable to walk because her legs were not coordinated and her head was swimming. The patient reports she feels much better when she is laying down. It feels better to keep her eyes closed. She denies any headache.  Patient is a 65 y.o. female presenting with dizziness.  Dizziness Associated symptoms: no chest pain, no diarrhea, no headaches, no nausea, no palpitations, no shortness of breath, no vomiting and no weakness     Past Medical History  Diagnosis Date  . Brain tumor [facility])    History reviewed. No pertinent past surgical history. No family history on file. Social History  Substance Use Topics  . Smoking status: Never Smoker   . Smokeless tobacco: Never Used  . Alcohol Use: No   OB History    No data available     Review of Systems  Constitutional: Negative for fever, appetite change and fatigue.  HENT: Negative for congestion, postnasal drip, rhinorrhea and sinus pressure.   Respiratory: Negative for cough, chest tightness and shortness of breath.   Cardiovascular: Negative for chest pain, palpitations and leg swelling.   Gastrointestinal: Negative for nausea, vomiting, abdominal pain and diarrhea.  Genitourinary: Negative for dysuria, urgency and frequency.  Musculoskeletal: Negative for myalgias and back pain.  Skin: Negative for rash.  Neurological: Positive for dizziness. Negative for seizures, syncope, facial asymmetry, weakness, light-headedness and headaches.  Hematological: Does not bruise/bleed easily.      Allergies  Review of patient's allergies indicates no known allergies.  Home Medications   Prior to Admission medications   Medication Sig Start Date End Date Taking? Authorizing Provider  hydroxypropyl methylcellulose / hypromellose (ISOPTO TEARS / GONIOVISC) 2.5 % ophthalmic solution Place 1 drop into both eyes 3 (three) times daily as needed for dry eyes.   Yes Historical Provider, MD  lamoTRIgine (LAMICTAL) 150 MG tablet Take 150 mg by mouth 2 (two) times daily.  03/21/14  Yes Historical Provider, MD  levETIRAcetam (KEPPRA) 750 MG tablet Take 1,500 mg by mouth 2 (two) times daily.   Yes Historical Provider, MD  meclizine (ANTIVERT) 25 MG tablet Take 1 tablet (25 mg total) by mouth 3 (three) times daily as needed for dizziness. 02/28/15   Harvel Quale, MD  moxifloxacin (VIGAMOX) 0.5 % ophthalmic solution Place 1 drop into the left eye 3 (three) times daily. Place one drop in left eye every 2 hours while awake for 2 days. Then every 4 hours while awake for 5 days. Patient not taking: Reported on 02/28/2015 05/07/14   Britt Bottom, NP   BP 100/61 mmHg  Pulse 54  Temp(Src) 98.1 F (36.7 C) (Oral)  Resp 15  SpO2 100%  LMP 03/11/1994 Physical Exam  Constitutional: She is oriented to person, place, and time. She appears well-developed and well-nourished. No distress.  HENT:  Head: Normocephalic and atraumatic.  Right Ear: External ear normal.  Left Ear: External ear normal.  Nose: Nose normal.  Mouth/Throat: Oropharynx is clear and moist. No oropharyngeal exudate.  Eyes: EOM are  normal. Pupils are equal, round, and reactive to light.  Neck: Normal range of motion. Neck supple.  Cardiovascular: Normal rate, regular rhythm, normal heart sounds and intact distal pulses.   No murmur heard. Pulmonary/Chest: Effort normal. No respiratory distress. She has no wheezes. She has no rales.  Abdominal: Soft. She exhibits no distension. There is no tenderness.  Musculoskeletal: Normal range of motion. She exhibits no edema or tenderness.  Neurological: She is alert and oriented to person, place, and time. She has normal strength. No cranial nerve deficit or sensory deficit. She exhibits normal muscle tone. Coordination (slightly ataxic on finger to nose examination.  Significant difficulty with heel to shin on examination) abnormal.  NIHSS 2  Skin: Skin is warm and dry. No rash noted. She is not diaphoretic.  Vitals reviewed.   ED Course  Procedures (including critical care time) Labs Review Labs Reviewed  APTT - Abnormal; Notable for the following:    aPTT 23 (*)    All other components within normal limits  ETHANOL  PROTIME-INR  CBC  DIFFERENTIAL  COMPREHENSIVE METABOLIC PANEL  URINE RAPID DRUG SCREEN, HOSP PERFORMED  URINALYSIS, ROUTINE W REFLEX MICROSCOPIC (NOT AT [facility])  LEVETIRACETAM LEVEL  LAMOTRIGINE LEVEL  I-STAT TROPOININ, ED    Imaging Review Ct Head Wo Contrast  02/28/2015  CLINICAL DATA:  Ataxia since yesterday afternoon.  Dizziness. EXAM: CT HEAD WITHOUT CONTRAST TECHNIQUE: Contiguous axial images were obtained from the base of the skull through the vertex without intravenous contrast. COMPARISON:  11/12/2006 FINDINGS: Postoperative changes in the frontal region. Encephalomalacia within the frontal lobes bilaterally, stable since prior study. No acute intracranial abnormality. Specifically, no hemorrhage, hydrocephalus, mass lesion, acute infarction, or significant intracranial injury. No acute calvarial abnormality. Visualized paranasal sinuses and mastoids  clear. Orbital soft tissues unremarkable. IMPRESSION: Postoperative changes an encephalomalacia in the frontal lobes bilaterally, stable since 2008. No acute intracranial abnormality. Electronically Signed   By: Rolm Baptise M.D.   On: 02/28/2015 10:29   Mr Brain Wo Contrast  02/28/2015  CLINICAL DATA:  Acute dizziness and ataxia.  History of brain tumor. EXAM: MRI HEAD WITHOUT CONTRAST TECHNIQUE: Multiplanar, multiecho pulse sequences of the brain and surrounding structures were obtained without intravenous contrast. COMPARISON:  Head CT from earlier today FINDINGS: Calvarium and upper cervical spine: Sequela of bifrontal craniotomy for olfactory groove meningioma. There is also thickening of the anterior cranial fossa floor which is likely combination of radiation and cranioplasty. Orbits: Bilateral staphyloma.  No acute finding. Sinuses and Mastoids: Mild mucosal edema and ethmoid and sphenoid sinuses. No acute sinusitis. Brain: Extensive encephalomalacia and gliosis in the bilateral inferior frontal lobes which is likely related to previous mass effect, surgical exposure, and previous radiation. There is no evidence of superimposed edema or mass effect. Expansile sella and Meckel's caves with mild downward retraction of the optic chiasm, presumed postoperative findings. There is hypo intense tissue along the right posterior clinoid and petrous ridge which measures 17 mm by 11 mm axially. This area appears completely calcified on previous head CT and is stable from 2008. There is chronic soft tissue intensity material in the upper nasal cavity  contiguous with the thickened and distorted anterior cranial fossa floor. This too is stable by CT since at least 2008. This tissue could reflect postoperative change, mucosal polyps, or residual meningioma. No explanation for acute symptoms, including infarct, hemorrhage, hydrocephalus, or major vessel occlusion. IMPRESSION: 1. No acute finding to explain symptoms. 2.  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 Tissue in the left nasal cavity and on the right petrous ridge is predominantly ossified and stable since 2008 by CT. Please ensure patient is up-to-date on surveillance imaging, reportedly performed at [facility]. Electronically Signed   By: Monte Fantasia M.D.   On: 02/28/2015 13:48   I have personally reviewed and evaluated these images and lab results as part of my medical decision-making.   EKG Interpretation   Date/Time:  Tuesday February 28 2015 08:28:51 EST Ventricular Rate:  60 PR Interval:  174 QRS Duration: 105 QT Interval:  424 QTC Calculation: 424 R Axis:   0 Text Interpretation:  Sinus rhythm No significant change since last  tracing Confirmed by Kateline Kinkade ([id number]) on 02/28/2015 8:42:09 AM      MDM  Patient was seen and evaluated in stable condition. Patient presenting with vertiginous symptoms and ataxia that started yesterday afternoon. Laboratory studies, EKG, head CT unremarkable. Patient was given Antivert with improvement in symptoms but still was not able to ambulate normally. MRI was obtained that was negative for acute process. Patient felt better on reexamination and was able to ambulate without difficulty. She was able to do toe to heel walking without difficulty. There was no ataxia on repeat examination. Case had been discussed with neurology who just recommended checking medication levels. All results and clinical impression were discussed with patient and her husband at length at bedside. Patient was discharged home in stable condition with instruction to follow-up with her neurologist. Marya Amsler informed that her drug levels would not come back until tomorrow. I told the patient that she could call for these results. Neurology did not feel the patient needed to wait for these results to come back. Final diagnoses:  Vertigo    1. Vertigo    Harvel Quale, MD 02/28/15 [phone]

## 2015-02-28 NOTE — ED Notes (Signed)
MD at bedside. 

## 2015-02-28 NOTE — ED Notes (Signed)
Patient still off the floor for scans.  

## 2015-02-28 NOTE — ED Notes (Signed)
Patient comes from home with Husband with complaints of dizziness. Patient has Hx of brain tumor. States normally gets dizzy but only last about 15 mins then goes away. Patient states she was feeling dizzy when she went to bed and has not resolved. Patient alert and oriented on arrival. Patient able to move all extremities. Patient can open eyed but states she prefers them closed.

## 2015-02-28 NOTE — ED Notes (Signed)
Patient sent to MRI by another staff member.

## 2015-02-28 NOTE — ED Notes (Signed)
NT Tramaine attempted to ambulate patient due to MD request patient unable to get out of bed.

## 2015-03-01 LAB — LAMOTRIGINE LEVEL: Lamotrigine Lvl: 16.9 ug/mL (ref 2.0–20.0)

## 2015-03-05 LAB — LEVETIRACETAM LEVEL: Levetiracetam Lvl: 16.8 ug/mL (ref 10.0–40.0)

## 2015-08-10 DIAGNOSIS — N952 Postmenopausal atrophic vaginitis: Secondary | ICD-10-CM | POA: Diagnosis not present

## 2015-08-10 DIAGNOSIS — Z8742 Personal history of other diseases of the female genital tract: Secondary | ICD-10-CM | POA: Diagnosis not present

## 2015-11-20 DIAGNOSIS — Z23 Encounter for immunization: Secondary | ICD-10-CM | POA: Diagnosis not present

## 2016-01-02 DIAGNOSIS — H43813 Vitreous degeneration, bilateral: Secondary | ICD-10-CM | POA: Diagnosis not present

## 2016-01-25 ENCOUNTER — Encounter: Payer: Self-pay | Admitting: Neurology

## 2016-01-25 ENCOUNTER — Ambulatory Visit (INDEPENDENT_AMBULATORY_CARE_PROVIDER_SITE_OTHER): Payer: Medicare Other | Admitting: Neurology

## 2016-01-25 VITALS — BP 110/71 | HR 59 | Ht 66.0 in | Wt 124.5 lb

## 2016-01-25 DIAGNOSIS — G40209 Localization-related (focal) (partial) symptomatic epilepsy and epileptic syndromes with complex partial seizures, not intractable, without status epilepticus: Secondary | ICD-10-CM | POA: Diagnosis not present

## 2016-01-25 DIAGNOSIS — Z86011 Personal history of benign neoplasm of the brain: Secondary | ICD-10-CM

## 2016-01-25 MED ORDER — LAMOTRIGINE 150 MG PO TABS: 150.0000 mg | ORAL_TABLET | Freq: Two times a day (BID) | ORAL | 4 refills | Status: DC

## 2016-01-25 NOTE — Patient Instructions (Signed)
Depakote DR 500mg  twice a day

## 2016-01-25 NOTE — Progress Notes (Signed)
PATIENT: Vickie Gross DOB: 11/09/1950  Chief Complaint  Patient presents with  . Focal Seizures    Last saw [name] in January 2016.  Reports having 1-2 seizures since her last appointment.  Says she is on an alternating dose schedule of both Lamictal and Keppra (noted in med list).  . Multiple Meningiomas    Condition has been monitored by repeat MRI brain scans.  Risk factor for seizures.  . Neurologist    [name], MD ([facility]) - referred to establish care closer to home     Vickie Gross is a 65 years old right-handed female,, seen in refer by her previous neurologist Dr. [name] for evaluation of focal seizure, history of multiple meningioma, initial evaluation was on January 25 2016.  She had bilateral frontal meningioma resection in 1998, she presented with gradual onset memory loss, confusion, partial seizure, got lost while driving, post surgically, she had recurrent partial seizure, depression, was treated with different medications,   She also had a yearly MRI follow-up by [facility] oncologist [name], was seen by neurologist [name] in our office in the past for partial seizure control, most recent neurological care was under [name],  who described different seizure types, event 1, hyperventilation, then lost consciousness, become stiff, lasting for 5 minutes, 2-3 times per year, event 2, zone out, eyes dilated, rolled back in her head, semi-rigid, foam coming out of mouth. Event 3, loss of consciousness without any warnings, eyes rolled back,  I have personally reviewed MRI of the brain with without contrast in January 2017, history of olfactory groove meningioma with extensive bilateral frontal gliosis, tissues in the left nasal cavity and on the right petrous ridge is predominantly ossified and stable since 2008,  Currently she is taking lamotrigine 150 mg twice a day, Keppra 750 mg 2 tablets twice a day since  2014,  She continues to have depression, she used to work as a Nurse, mental health, now on disability, husband was recently diagnosed with leukemia.  Lamictal has helped her mood, she denies significant side effect from [place], she could not recall that she tried Depakote in the past.  I also personally reviewed outside records from [facility], multiple repeat MRIs most recent in 2016 showed no change, evidence of left over olfactory groove meningioma, extending into ethmoid sinus, stable bilateral frontal extensive encephalomalacia  REVIEW OF SYSTEMS: Full 14 system review of systems performed and notable only for depression, decreased energy, disinterested in activities, numbness, weakness, slurred speech, easy bruising, blurred vision, loss of vision, cough, constipation, fatigue, itching  ALLERGIES: No Known Allergies  HOME MEDICATIONS: Current Outpatient Prescriptions  Medication Sig Dispense Refill  . lamoTRIgine (LAMICTAL) 150 MG tablet Take 150 mg by mouth 2 (two) times daily.     Marland Kitchen levETIRAcetam (KEPPRA) 750 MG tablet Take 1,500 mg by mouth 2 (two) times daily.     No current facility-administered medications for this visit.     PAST MEDICAL HISTORY: Past Medical History:  Diagnosis Date  . Brain tumor ([place])   . Focal seizures ([place])   . Meningiomas, multiple ([place])   . Vertigo     PAST SURGICAL HISTORY: Past Surgical History:  Procedure Laterality Date  . BRAIN SURGERY     x 4 (gamma knife, tumor removals, radiation)    FAMILY HISTORY: Family History  Problem Relation Age of Onset  . Lung cancer Mother   . Heart disease  Father   . Heart attack Father     SOCIAL HISTORY:  Social History   Social History  . Marital status: Married    Spouse name: N/A  . Number of children: 2  . Years of education: Bachelors   Occupational History  . Translator - part-time    Social History Main Topics  . Smoking status: Never Smoker  .  Smokeless tobacco: Never Used  . Alcohol use No  . Drug use: No  . Sexual activity: Not on file   Other Topics Concern  . Not on file   Social History Narrative   Lives at home with her husband.   Right-handed.   2 cups caffeine per day.     PHYSICAL EXAM   Vitals:   01/25/16 0836  BP: 110/71  Pulse: (!) 59  Weight: 124 lb 8 oz (56.5 kg)  Height: 5\' 6"  (1.676 m)    Not recorded      Body mass index is 20.09 kg/m.  PHYSICAL EXAMNIATION:  Gen: NAD, conversant, well nourised, obese, well groomed                     Cardiovascular: Regular rate rhythm, no peripheral edema, warm, nontender. Eyes: Conjunctivae clear without exudates or hemorrhage Neck: Supple, no carotid bruits. Pulmonary: Clear to auscultation bilaterally   NEUROLOGICAL EXAM:  MENTAL STATUS: Speech:    Speech is normal; fluent and spontaneous with normal comprehension.  Cognition:     Orientation to time, place and person     Normal recent and remote memory     Normal Attention span and concentration     Normal Language, naming, repeating,spontaneous speech     Fund of knowledge   CRANIAL NERVES: CN II: Visual fields are full to confrontation. Fundoscopic exam is normal with sharp discs and no vascular changes. Pupils are round equal and briskly reactive to light. CN III, IV, VI: extraocular movement are normal. No ptosis. CN V: Facial sensation is intact to pinprick in all 3 divisions bilaterally. Corneal responses are intact.  CN VII: Face is symmetric with normal eye closure and smile. CN VIII: Hearing is normal to rubbing fingers CN IX, X: Palate elevates symmetrically. Phonation is normal. CN XI: Head turning and shoulder shrug are intact CN XII: Tongue is midline with normal movements and no atrophy.  MOTOR: There is no pronator drift of out-stretched arms. Muscle bulk and tone are normal. Muscle strength is normal.  REFLEXES: Reflexes are 2+ and symmetric at the biceps, triceps,  knees, and ankles. Plantar responses are flexor.  SENSORY: Intact to light touch, pinprick, positional sensation and vibratory sensation are intact in fingers and toes.  COORDINATION: Rapid alternating movements and fine finger movements are intact. There is no dysmetria on finger-to-nose and heel-knee-shin.    GAIT/STANCE: Posture is normal. Gait is steady with normal steps, base, arm swing, and turning. Heel and toe walking are normal. Tandem gait is normal.  Romberg is absent.   DIAGNOSTIC DATA (LABS, IMAGING, TESTING) - I reviewed patient records, labs, notes, testing and imaging myself where available.   ASSESSMENT AND PLAN  BLIA ALVARDO is a 65 y.o. female   History of large size olfactory groove meningioma  Status post frontal cranial anatomy, evidence of stable significant bilateral frontal encephalomalacia, continued evidence of stable olfactory groove meningioma, extending to right petrous ridge.  Complex partial seizure.  Keep Lamotrigine 150mg  bid, keppra 750mg  ii bid.   May consider repeat MRI brain  w/wo in 12 months, switch Keppra to Depakote DR   Marcial Pacas, M.D. Ph.D.  [facility] [address] [place], [place] [address] Ph: [phone] Fax: [phone]  CC: [name], MD

## 2016-04-23 ENCOUNTER — Ambulatory Visit: Payer: Medicare Other | Admitting: Neurology

## 2016-06-18 ENCOUNTER — Ambulatory Visit: Payer: Medicare Other | Admitting: Neurology

## 2016-07-08 ENCOUNTER — Ambulatory Visit (INDEPENDENT_AMBULATORY_CARE_PROVIDER_SITE_OTHER): Payer: Medicare Other | Admitting: Neurology

## 2016-07-08 ENCOUNTER — Encounter: Payer: Self-pay | Admitting: Neurology

## 2016-07-08 VITALS — BP 106/64 | HR 63 | Ht 66.0 in | Wt 126.0 lb

## 2016-07-08 DIAGNOSIS — Z86011 Personal history of benign neoplasm of the brain: Secondary | ICD-10-CM

## 2016-07-08 DIAGNOSIS — G40209 Localization-related (focal) (partial) symptomatic epilepsy and epileptic syndromes with complex partial seizures, not intractable, without status epilepticus: Secondary | ICD-10-CM

## 2016-07-08 DIAGNOSIS — R5383 Other fatigue: Secondary | ICD-10-CM | POA: Diagnosis not present

## 2016-07-08 MED ORDER — DIVALPROEX SODIUM ER 500 MG PO TB24: 500.0000 mg | ORAL_TABLET | Freq: Every day | ORAL | 11 refills | Status: DC

## 2016-07-08 MED ORDER — DIVALPROEX SODIUM ER 500 MG PO TB24: 500.0000 mg | ORAL_TABLET | Freq: Every day | ORAL | 4 refills | Status: DC

## 2016-07-08 NOTE — Patient Instructions (Signed)
Continue take lamotrigine 150 mg one tablet twice a day  Add on new prescription Depakote ER 500 mg one tablet every night  Tapering off Keppra=levitiracetam  You are now taking Keppra 750mg  2 tab twice a day,  1st week, Keppra 750mg   One/one. 2nd week, Keppra 750mg    One at night. 3re week, stop Keppra

## 2016-07-08 NOTE — Progress Notes (Signed)
PATIENT: Vickie Gross DOB: 06-07-1950  Chief Complaint  Patient presents with  . Seizures    She is here for her rountine follow up.  No seizure activity reported.     HISTORICAL  Vickie Gross is a 66 years old right-handed female, seen in refer by her previous neurologist Dr. [name] for evaluation of focal seizure, history of multiple meningioma, initial evaluation was on January 25 2016.  She had bilateral frontal meningioma resection in 1998, she presented with gradual onset memory loss, confusion, partial seizure, got lost while driving, post surgically, she had recurrent partial seizure, depression, was treated with different medications,  She also reported laser surgery for residual meningioma, she also had resection surgery later because of recurrent tumor, this was done at [facility].  She also had a yearly MRI follow-up by [facility]'s oncologist [name], was seen by neurologist [name] in our office in the past for partial seizure control, most recent neurological care was under [name],  who described different seizure types, event 1, hyperventilation, then lost consciousness, become stiff, lasting for 5 minutes, 2-3 times per year, event 2, zone out, eyes dilated, rolled back in her head, semi-rigid, foam coming out of mouth. Event 3, loss of consciousness without any warnings, eyes rolled back,  I have personally reviewed MRI of the brain with without contrast in January 2017, history of olfactory groove meningioma with extensive bilateral frontal gliosis, tissues in the left nasal cavity and on the right petrous ridge is predominantly ossified and stable since 2008,  Currently she is taking lamotrigine 150 mg twice a day, Keppra 750 mg 2 tablets twice a day since 2014,  She continues to have depression, she used to work as a Nurse, mental health, now on disability, husband was recently diagnosed with  leukemia.  Lamictal has helped her mood, she denies significant side effect from [place], she could not recall that she tried Depakote in the past.  I also personally reviewed outside records from [facility], multiple repeat MRIs most recent in 2016 showed no change, evidence of left over olfactory groove meningioma, extending into ethmoid sinus, stable bilateral frontal extensive encephalomalacia  UPDATE Jul 08 2016: I was able to review her [facility] record by neurosurgeon Dr. Laban Emperor, recurrent olfactory groove meningioma, she had resection in [place] in 1998, had a small posterior cavernous sinus meningioma that underwent gamma knife radiation in December of 1998, she had recurrent tumor again in 2008, which involving as point sinus, some reoccurrence intra-cranial, she underwent resection of her recurrent meningioma on December 29 2006. There was a small amount of residual tumor attached to the medial aspect of optic nerve as well as a to that could not be resected, there is also small of tumor in the ethmoid sinus, she was later referred for radiation therapy, by [name], 50.4 Gy completed April 17, 2007, SRS to left anterior middle cranial fossa lesion 14 Gy 01/26/2013, last follow-up was on July 8 2 706, she supposed to have repeat MRI, continue follow-up on a yearly basis.  She denies recurrent seizure since last visit, but reported episode of sudden onset vertigo, lasting for a few minutes without loss of consciousness,  She is taking her antiemetic medication incorrectly, instead of taking lamotrigine 150 mg twice a day, Keppra 750 mg 2 tablets twice a day, she is only taking lamotrigine 150 mg/Keppra 750 mg every night, alternating 2 tablets of lamotrigine was 1 tablet of  Keppra versus 2 tablets of Keppra with 1 tablet of lamotrigine,  I reviewed laboratory evaluations, in January 2017, Keppra level was 16 point 8, lamotrigine level was 16 point 9, normal CMP, CBC,  She denied  loss sense of smell, we personally reviewed MRI of the brain with and without contrast in January 2017, there was evidence of bilateral frontal encephalomalacia, tissues in left nasal cavity and on the right patch was reached is stable compared to CT scanning 2008.  She also complains of difficulty with both vision, is not fully corrected by glasses.  REVIEW OF SYSTEMS: Full 14 system review of systems performed and notable only for blurred vision, muscle cramps, vertigo, depression, acting out of dreams  ALLERGIES: No Known Allergies  HOME MEDICATIONS: Current Outpatient Prescriptions  Medication Sig Dispense Refill  . lamoTRIgine (LAMICTAL) 150 MG tablet Take 1 tablet (150 mg total) by mouth 2 (two) times daily. 180 tablet 4  . levETIRAcetam (KEPPRA) 750 MG tablet Take 1,500 mg by mouth 2 (two) times daily.     No current facility-administered medications for this visit.     PAST MEDICAL HISTORY: Past Medical History:  Diagnosis Date  . Brain tumor ([place])   . Focal seizures ([place])   . Meningiomas, multiple ([place])   . Vertigo     PAST SURGICAL HISTORY: Past Surgical History:  Procedure Laterality Date  . BRAIN SURGERY     x 4 (gamma knife, tumor removals, radiation)    FAMILY HISTORY: Family History  Problem Relation Age of Onset  . Lung cancer Mother   . Heart disease Father   . Heart attack Father     SOCIAL HISTORY:  Social History   Social History  . Marital status: Married    Spouse name: N/A  . Number of children: 2  . Years of education: Bachelors   Occupational History  . Translator - part-time    Social History Main Topics  . Smoking status: Never Smoker  . Smokeless tobacco: Never Used  . Alcohol use No  . Drug use: No  . Sexual activity: Not on file   Other Topics Concern  . Not on file   Social History Narrative   Lives at home with her husband.   Right-handed.   2 cups caffeine per day.     PHYSICAL EXAM   Vitals:   07/08/16 0728   BP: 106/64  Pulse: 63  Weight: 126 lb (57.2 kg)  Height: 5\' 6"  (1.676 m)    Not recorded      Body mass index is 20.34 kg/m.  PHYSICAL EXAMNIATION:  Gen: NAD, conversant, well nourised, obese, well groomed                     Cardiovascular: Regular rate rhythm, no peripheral edema, warm, nontender. Eyes: Conjunctivae clear without exudates or hemorrhage Neck: Supple, no carotid bruits. Pulmonary: Clear to auscultation bilaterally   NEUROLOGICAL EXAM:  MENTAL STATUS: Speech:    Speech is normal; fluent and spontaneous with normal comprehension.  Cognition:     Orientation to time, place and person     Normal recent and remote memory     Normal Attention span and concentration     Normal Language, naming, repeating,spontaneous speech     Fund of knowledge   CRANIAL NERVES: CN II: Visual fields are full to confrontation. Fundoscopic exam is normal with sharp discs and no vascular changes.Visual acuity OD 20/70, OS 20/70 Pupils are round equal and briskly  reactive to light. CN III, IV, VI: extraocular movement are normal. No ptosis. CN V: Facial sensation is intact to pinprick in all 3 divisions bilaterally. Corneal responses are intact.  CN VII: Face is symmetric with normal eye closure and smile. CN VIII: Hearing is normal to rubbing fingers CN IX, X: Palate elevates symmetrically. Phonation is normal. CN XI: Head turning and shoulder shrug are intact CN XII: Tongue is midline with normal movements and no atrophy.  MOTOR: There is no pronator drift of out-stretched arms. Muscle bulk and tone are normal. Muscle strength is normal.  REFLEXES: Reflexes are 2+ and symmetric at the biceps, triceps, knees, and ankles. Plantar responses are flexor.  SENSORY: Intact to light touch, pinprick, positional sensation and vibratory sensation are intact in fingers and toes.  COORDINATION: Rapid alternating movements and fine finger movements are intact. There is no dysmetria on  finger-to-nose and heel-knee-shin.    GAIT/STANCE: Posture is normal. Gait is steady with normal steps, base, arm swing, and turning. Heel and toe walking are normal. Tandem gait is normal.  Romberg is absent.   DIAGNOSTIC DATA (LABS, IMAGING, TESTING) - I reviewed patient records, labs, notes, testing and imaging myself where available.   ASSESSMENT AND PLAN  Vickie Gross is a 66 y.o. female   History of large size olfactory groove meningioma  Status post frontal craniotomay, evidence of stable significant bilateral frontal encephalomalacia, continued evidence of stable olfactory groove meningioma, extending to right petrous ridge and left nasal cavity  Complex partial seizure.  Check lamotrigine level,  Keep lamotrigine 150 mg twice a day, add on Depakote ER 500 mg every night  Repeat MRI of the brain with and without contrast Decreased bilateral vision  Refer her to ophthalmologist   Marcial Pacas, M.D. Ph.D.  [facility] [address], [place], [place] [address] Ph: [phone] Fax: [phone]  CC: [name], MD

## 2016-07-11 LAB — CBC
Hematocrit: 37.9 % (ref 34.0–46.6)
Hemoglobin: 12.1 g/dL (ref 11.1–15.9)
MCH: 27.6 pg (ref 26.6–33.0)
MCHC: 31.9 g/dL (ref 31.5–35.7)
MCV: 87 fL (ref 79–97)
PLATELETS: 261 10*3/uL (ref 150–379)
RBC: 4.38 x10E6/uL (ref 3.77–5.28)
RDW: 14.2 % (ref 12.3–15.4)
WBC: 4.7 10*3/uL (ref 3.4–10.8)

## 2016-07-11 LAB — TSH: TSH: 2.31 u[IU]/mL (ref 0.450–4.500)

## 2016-07-11 LAB — COMPREHENSIVE METABOLIC PANEL
A/G RATIO: 1.9 (ref 1.2–2.2)
ALK PHOS: 64 IU/L (ref 39–117)
ALT: 12 IU/L (ref 0–32)
AST: 22 IU/L (ref 0–40)
Albumin: 4.8 g/dL (ref 3.6–4.8)
BILIRUBIN TOTAL: 0.3 mg/dL (ref 0.0–1.2)
BUN / CREAT RATIO: 19 (ref 12–28)
BUN: 15 mg/dL (ref 8–27)
CHLORIDE: 99 mmol/L (ref 96–106)
CO2: 23 mmol/L (ref 18–29)
Calcium: 9.7 mg/dL (ref 8.7–10.3)
Creatinine, Ser: 0.79 mg/dL (ref 0.57–1.00)
GFR calc Af Amer: 91 mL/min/{1.73_m2} (ref >59)
GFR calc non Af Amer: 79 mL/min/{1.73_m2} (ref >59)
GLOBULIN, TOTAL: 2.5 g/dL (ref 1.5–4.5)
Glucose: 79 mg/dL (ref 65–99)
POTASSIUM: 4.3 mmol/L (ref 3.5–5.2)
Sodium: 140 mmol/L (ref 134–144)
Total Protein: 7.3 g/dL (ref 6.0–8.5)

## 2016-07-11 LAB — LAMOTRIGINE LEVEL: LAMOTRIGINE LVL: 6.7 ug/mL (ref 2.0–20.0)

## 2016-07-11 LAB — LEVETIRACETAM LEVEL: Levetiracetam Lvl: 34.5 ug/mL (ref 10.0–40.0)

## 2016-07-25 IMAGING — MG MM SCREEN MAMMOGRAM BILATERAL
5 series · 5 of 5 positions shown · non-contrast
Comparison: Previous exam(s).

CLINICAL DATA: Screening.

EXAM:
DIGITAL SCREENING BILATERAL MAMMOGRAM WITH CAD

[R CC (1 of 2)]
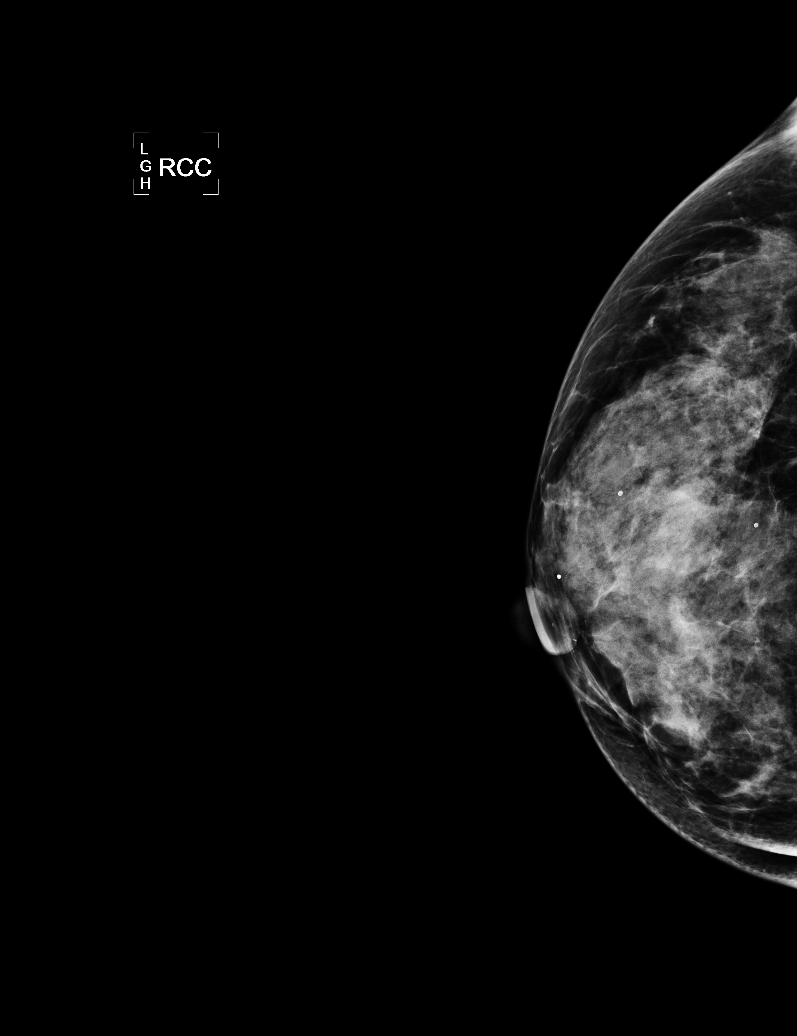

[L CC]
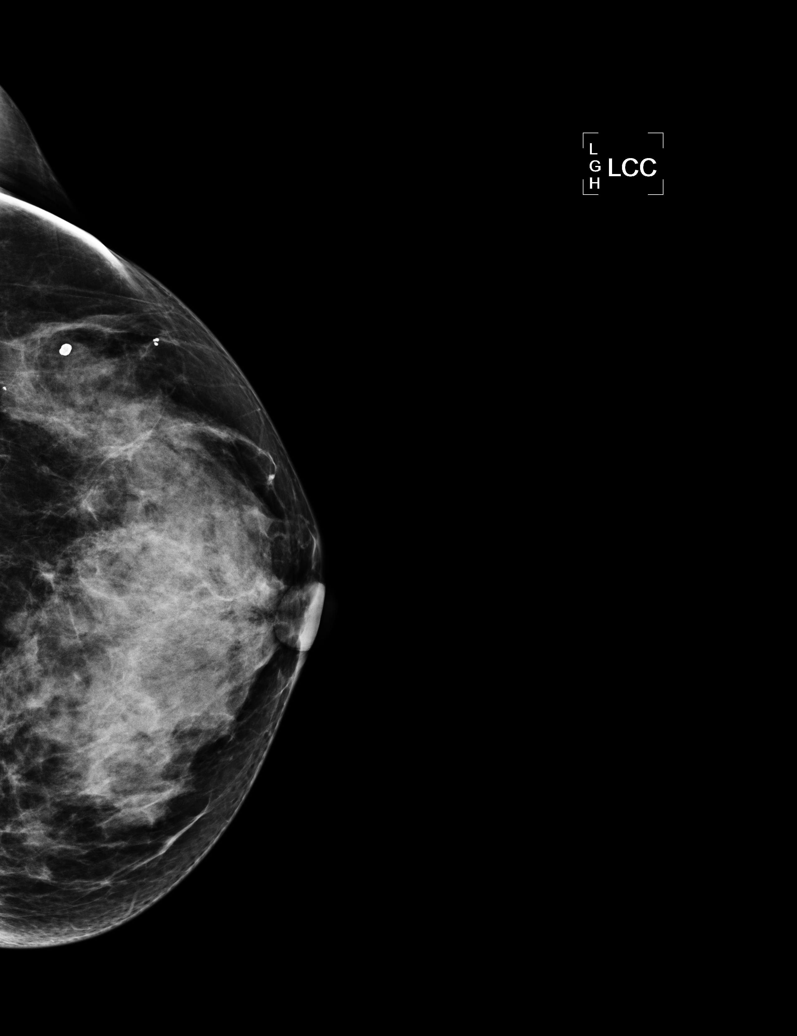

[L MLO]
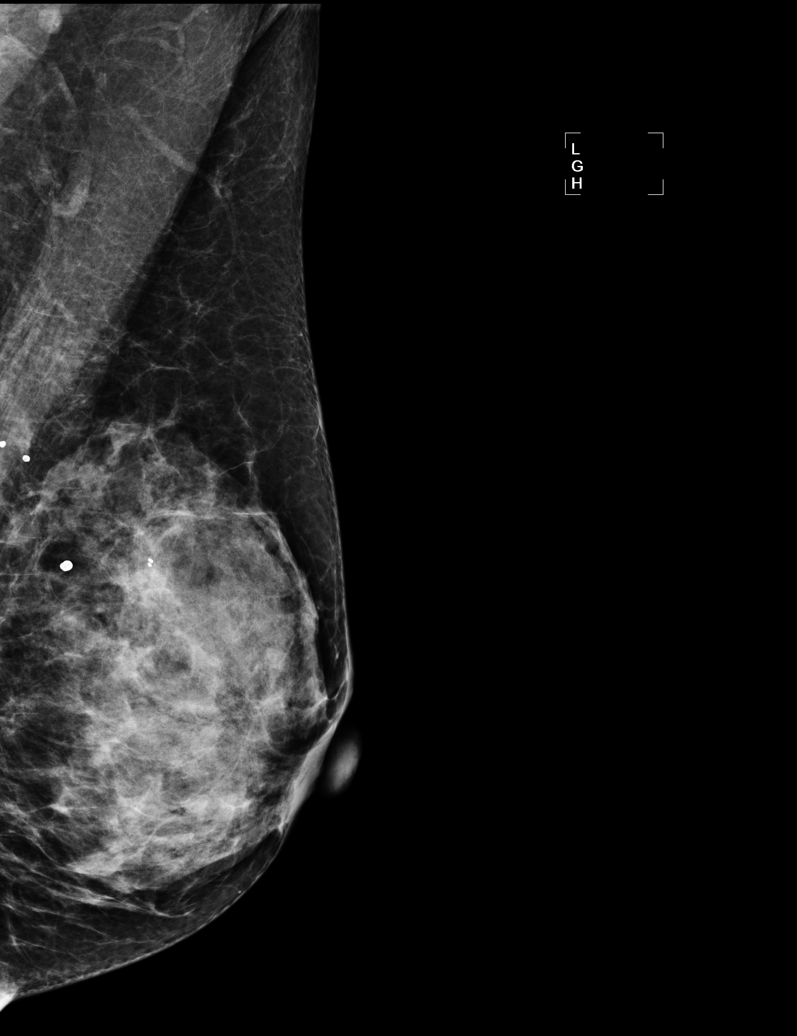

[R MLO]
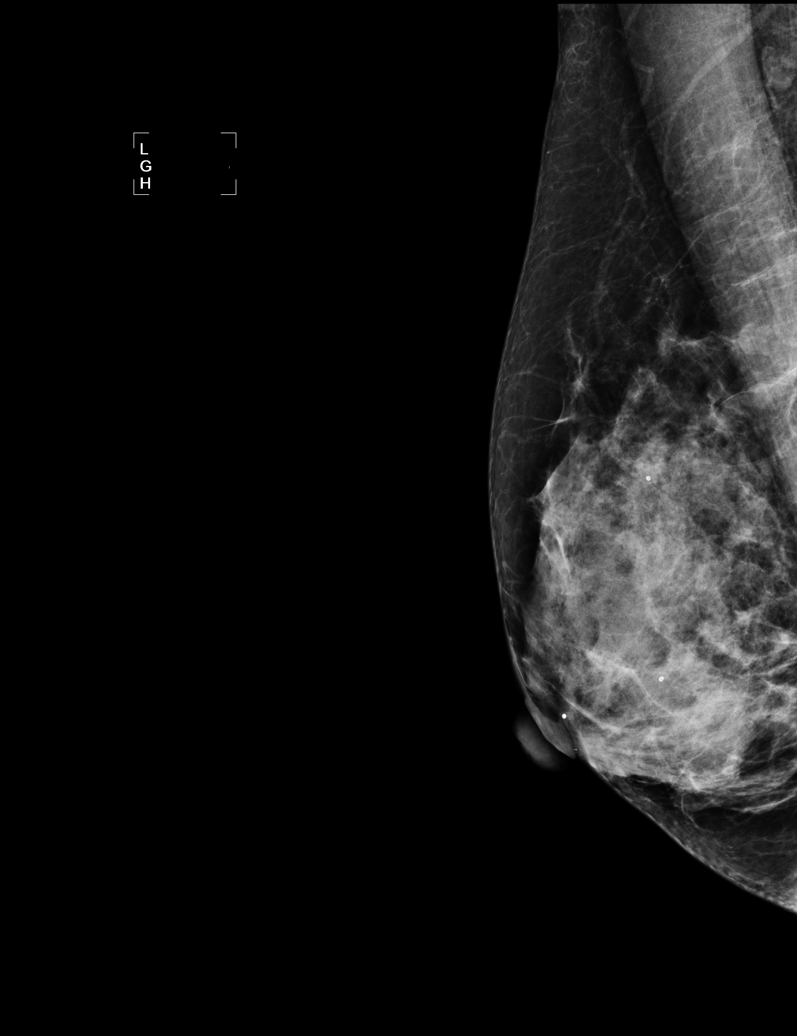

[R CC (2 of 2)]
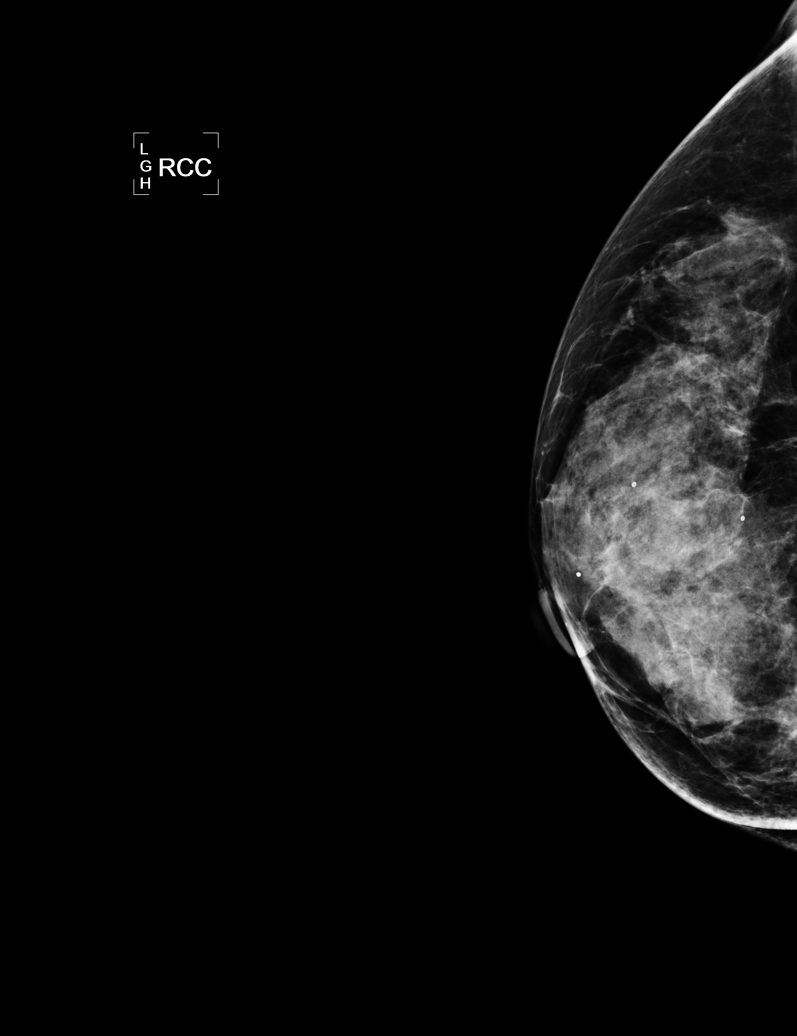

[5 of 5 positions shown; findings below may reference images not displayed]

ACR Breast Density Category d: The breast tissue is extremely dense,
which lowers the sensitivity of mammography.
FINDINGS: There are no findings suspicious for malignancy. Images were
processed with CAD.
IMPRESSION: No mammographic evidence of malignancy. A result letter of this
screening mammogram will be mailed directly to the patient.

RECOMMENDATION:
Screening mammogram in one year. (Code:BD-D-K0F)

BI-RADS CATEGORY  1: Negative.

## 2016-07-28 ENCOUNTER — Ambulatory Visit
Admission: RE | Admit: 2016-07-28 | Discharge: 2016-07-28 | Disposition: A | Discharge status: Home / Self Care | Payer: Medicare Other | Source: Ambulatory Visit | Attending: Neurology | Admitting: Neurology

## 2016-07-28 DIAGNOSIS — G40209 Localization-related (focal) (partial) symptomatic epilepsy and epileptic syndromes with complex partial seizures, not intractable, without status epilepticus: Secondary | ICD-10-CM | POA: Diagnosis not present

## 2016-07-28 DIAGNOSIS — D329 Benign neoplasm of meninges, unspecified: Secondary | ICD-10-CM | POA: Diagnosis not present

## 2016-07-28 DIAGNOSIS — Z86011 Personal history of benign neoplasm of the brain: Secondary | ICD-10-CM | POA: Diagnosis not present

## 2016-07-28 MED ORDER — GADOBENATE DIMEGLUMINE 529 MG/ML IV SOLN
10.0000 mL | Freq: Once | INTRAVENOUS | Status: AC | PRN
  Administered 2016-07-28: 10 mL via INTRAVENOUS

## 2016-07-29 ENCOUNTER — Telehealth: Payer: Self-pay | Admitting: Neurology

## 2016-07-29 NOTE — Telephone Encounter (Signed)
Spoke to patient - she is aware of results and will keep her pending follow up for further review.

## 2016-07-29 NOTE — Telephone Encounter (Signed)
Please call patient, MRI of the brain only showed subtle changes, I will review MRI film with her at her next follow-up visit,   IMPRESSION:  This MRI of the brain with and without contrast shows the following: 1.    The sequela of a prior olfactory ridge meningioma with bifrontal encephalomalacia and postoperative changes in the upper nasal cavity/anterior cranial fossa floor. This appears unchanged when compared to the previous MRI. 2.    171016 mm enhancing mass along the right clinoid and petrous ridge appears unchanged when compared to the previous MRI was also noted on CT scan from 11/12/2006. This likely represents stable residual tumor.   3.    Two dural based homogenously enhancing masses adjacent to the frontal lobes measuring 10 x 5 mm on the right and 11 x 9 mm on the left. These likely represent small meningiomas.   The left mass was present on the 2017 MRI, the mass on the right was not clearly present. Neither appears to be present on the 2008 CT scan.  4.    There are no acute findings.

## 2016-08-12 ENCOUNTER — Emergency Department (HOSPITAL_COMMUNITY)
Admission: EM | Admit: 2016-08-12 | Discharge: 2016-08-12 | Disposition: A | Discharge status: Home / Self Care | Payer: Medicare Other | Attending: Emergency Medicine | Admitting: Emergency Medicine

## 2016-08-12 ENCOUNTER — Encounter (HOSPITAL_COMMUNITY): Payer: Self-pay | Admitting: Emergency Medicine

## 2016-08-12 DIAGNOSIS — M549 Dorsalgia, unspecified: Secondary | ICD-10-CM | POA: Diagnosis present

## 2016-08-12 DIAGNOSIS — Z85841 Personal history of malignant neoplasm of brain: Secondary | ICD-10-CM | POA: Diagnosis not present

## 2016-08-12 DIAGNOSIS — M62830 Muscle spasm of back: Secondary | ICD-10-CM | POA: Diagnosis not present

## 2016-08-12 DIAGNOSIS — Z79899 Other long term (current) drug therapy: Secondary | ICD-10-CM | POA: Diagnosis not present

## 2016-08-12 HISTORY — DX: Neoplasm of unspecified behavior of breast: D49.3

## 2016-08-12 MED ORDER — IBUPROFEN 200 MG PO TABS
600.0000 mg | ORAL_TABLET | Freq: Once | ORAL | Status: AC
  Administered 2016-08-12: 600 mg via ORAL
  Filled 2016-08-12: qty 3

## 2016-08-12 MED ORDER — IBUPROFEN 600 MG PO TABS: 600.0000 mg | ORAL_TABLET | Freq: Three times a day (TID) | ORAL | 0 refills | Status: DC | PRN

## 2016-08-12 MED ORDER — DIAZEPAM 5 MG PO TABS
5.0000 mg | ORAL_TABLET | Freq: Once | ORAL | Status: AC
  Administered 2016-08-12: 5 mg via ORAL
  Filled 2016-08-12: qty 1

## 2016-08-12 MED ORDER — METHOCARBAMOL 500 MG PO TABS
500.0000 mg | ORAL_TABLET | Freq: Two times a day (BID) | ORAL | 0 refills | Status: DC
Start: 2016-08-12 — End: 2017-01-07

## 2016-08-12 NOTE — Discharge Instructions (Signed)
Please take the medicines prescribed. You have muscle spasms - and you will have to stretch the back muscles and massage the area for relief. You also will benefit from warm compresses on those side. See your doctor.  THE MUSCLE RELAXANT MEDICINE WILL MAKE YOU DROWSY - SO BE CAREFUL WHEN TAKING IT.

## 2016-08-12 NOTE — ED Provider Notes (Signed)
[facility] DEPT Provider Note   CSN: [id number] Arrival date & time: 08/12/16  0220  By signing my name below, I, Ny'Kea Lewis, attest that this documentation has been prepared under the direction and in the presence of Varney Biles, MD. Electronically Signed: Lise Auer, ED Scribe. 08/12/16. 3:45 AM.  History   Chief Complaint Chief Complaint  Patient presents with  . Back Pain   The history is provided by the patient. No language interpreter was used.   HPI Comments: Vickie Gross is a 66 y.o. female with a PMHx of breast/brain tumors, seizures, and vertigo who presents to the Emergency Department with gradually worsening, consistent right shoulder pain that began two days ago.  She reports the pain radiating into her upper/mid back.  Denies recent traumatic injury or falls. She has tried massages, heat, and Tylenol with no relief. She reports frequent usage of her right arm. Her pain is worsened while sitting.   Past Medical History:  Diagnosis Date  . Brain tumor ([place])   . Breast tumor   . Focal seizures ([place])   . Meningiomas, multiple ([place])   . Vertigo     Patient Active Problem List   Diagnosis Date Noted  . Hx of meningioma of the brain 01/25/2016  . HYPERLIPIDEMIA 01/04/2008  . DEPRESSION 01/04/2008  . Complex partial seizure ([place]) 01/04/2008    Past Surgical History:  Procedure Laterality Date  . BRAIN SURGERY     x 4 (gamma knife, tumor removals, radiation)  . BREAST SURGERY     OB History    No data available     Home Medications    Prior to Admission medications   Medication Sig Start Date End Date Taking? Authorizing Provider  divalproex (DEPAKOTE ER) 500 MG 24 hr tablet Take 1 tablet (500 mg total) by mouth at bedtime. 07/08/16   Marcial Pacas, MD  divalproex (DEPAKOTE ER) 500 MG 24 hr tablet Take 1 tablet (500 mg total) by mouth at bedtime. 07/08/16   Marcial Pacas, MD  ibuprofen (ADVIL,MOTRIN) 600 MG tablet Take 1 tablet (600 mg total) by mouth  every 8 (eight) hours as needed for moderate pain or cramping. 08/12/16   Varney Biles, MD  lamoTRIgine (LAMICTAL) 150 MG tablet Take 1 tablet (150 mg total) by mouth 2 (two) times daily. 01/25/16   Marcial Pacas, MD  levETIRAcetam (KEPPRA) 750 MG tablet Take 1,500 mg by mouth 2 (two) times daily.    [provider]  methocarbamol (ROBAXIN) 500 MG tablet Take 1 tablet (500 mg total) by mouth 2 (two) times daily. 08/12/16   Varney Biles, MD   Family History Family History  Problem Relation Age of Onset  . Lung cancer Mother   . Heart disease Father   . Heart attack Father   . Diabetes Father   . Diabetes Sister   . Heart attack Sister   . Lung cancer Sister    Social History Social History  Substance Use Topics  . Smoking status: Never Smoker  . Smokeless tobacco: Never Used  . Alcohol use No   Allergies   Patient has no known allergies.  Review of Systems Review of Systems  Musculoskeletal: Positive for back pain and myalgias.   Physical Exam Updated Vital Signs BP 119/73 (BP Location: Left Arm)   Pulse 65   Temp 98.3 F (36.8 C) (Oral)   Resp 18   LMP 03/11/1994   SpO2 99%   Physical Exam  Constitutional: She is oriented to person,  place, and time. She appears well-developed and well-nourished.  HENT:  Head: Normocephalic.  Eyes: EOM are normal.  Neck: Normal range of motion.  Pulmonary/Chest: Effort normal.  Abdominal: She exhibits no distension.  Musculoskeletal: Normal range of motion.  Pt has palpable spasms peri-scapular region. The tenderness is worse with palpation of the spasm. No rash appreciated.  Neurological: She is alert and oriented to person, place, and time.  Psychiatric: She has a normal mood and affect.  Nursing note and vitals reviewed.  ED Treatments / Results  DIAGNOSTIC STUDIES: Oxygen Saturation is 99% on RA, normal by my interpretation.   COORDINATION OF CARE: 3:34 AM-Discussed next steps with pt. Pt verbalized  understanding and is agreeable with the plan.   Labs (all labs ordered are listed, but only abnormal results are displayed) Labs Reviewed - No data to display  EKG  EKG Interpretation None       Radiology No results found.  Procedures Procedures (including critical care time)  Medications Ordered in ED Medications  ibuprofen (ADVIL,MOTRIN) tablet 600 mg (600 mg Oral Given 08/12/16 0353)  diazepam (VALIUM) tablet 5 mg (5 mg Oral Given 08/12/16 0353)     Initial Impression / Assessment and Plan / ED Course  I have reviewed the triage vital signs and the nursing notes.  Pertinent labs & imaging results that were available during my care of the patient were reviewed by me and considered in my medical decision making (see chart for details).     Pt comes in with cc of back pain. Pt's pain is reproducible with palpation of spasms over the area she is hurting. Pt has no rash, and she has no chest pain. Pain also worse with certain position. Pain appears to be musculoskeletal due to our exam findings, and thus I dont think any imaging is needed. PT's pain has been present for 2 days. We will start her on nsaids and muscle relaxants. Conservative measures that can be used at home, including stretching, massage, warm showers and laying on tennis ball discussed. Pt advised to see her doctor in 2-3 days.    Final Clinical Impressions(s) / ED Diagnoses   Final diagnoses:  Muscle spasm of back    New Prescriptions Discharge Medication List as of 08/12/2016  3:48 AM    START taking these medications   Details  ibuprofen (ADVIL,MOTRIN) 600 MG tablet Take 1 tablet (600 mg total) by mouth every 8 (eight) hours as needed for moderate pain or cramping., Starting Mon 08/12/2016, Print    methocarbamol (ROBAXIN) 500 MG tablet Take 1 tablet (500 mg total) by mouth 2 (two) times daily., Starting Mon 08/12/2016, Print       I personally performed the services described in this  documentation, which was scribed in my presence. The recorded information has been reviewed and is accurate.     Varney Biles, MD 08/12/16 [phone]

## 2016-08-12 NOTE — ED Triage Notes (Signed)
Pt is c/o upper to mid back pain that started on Friday  Pt denies injury  Pt states it started in her right shoulder then moved to her mid back and then back into the shoulder area  Pt states she has tried massage, heat, and tylenol without relief

## 2016-08-21 DIAGNOSIS — M50123 Cervical disc disorder at C6-C7 level with radiculopathy: Secondary | ICD-10-CM | POA: Diagnosis not present

## 2016-08-21 DIAGNOSIS — M50323 Other cervical disc degeneration at C6-C7 level: Secondary | ICD-10-CM | POA: Diagnosis not present

## 2016-08-21 DIAGNOSIS — M50322 Other cervical disc degeneration at C5-C6 level: Secondary | ICD-10-CM | POA: Diagnosis not present

## 2016-08-21 DIAGNOSIS — M9902 Segmental and somatic dysfunction of thoracic region: Secondary | ICD-10-CM | POA: Diagnosis not present

## 2016-08-21 DIAGNOSIS — M9901 Segmental and somatic dysfunction of cervical region: Secondary | ICD-10-CM | POA: Diagnosis not present

## 2016-08-22 DIAGNOSIS — M50323 Other cervical disc degeneration at C6-C7 level: Secondary | ICD-10-CM | POA: Diagnosis not present

## 2016-08-22 DIAGNOSIS — M50322 Other cervical disc degeneration at C5-C6 level: Secondary | ICD-10-CM | POA: Diagnosis not present

## 2016-08-22 DIAGNOSIS — M50123 Cervical disc disorder at C6-C7 level with radiculopathy: Secondary | ICD-10-CM | POA: Diagnosis not present

## 2016-08-22 DIAGNOSIS — M9902 Segmental and somatic dysfunction of thoracic region: Secondary | ICD-10-CM | POA: Diagnosis not present

## 2016-08-22 DIAGNOSIS — M9901 Segmental and somatic dysfunction of cervical region: Secondary | ICD-10-CM | POA: Diagnosis not present

## 2016-08-27 DIAGNOSIS — Z1231 Encounter for screening mammogram for malignant neoplasm of breast: Secondary | ICD-10-CM | POA: Diagnosis not present

## 2016-08-27 DIAGNOSIS — N952 Postmenopausal atrophic vaginitis: Secondary | ICD-10-CM | POA: Diagnosis not present

## 2016-08-27 DIAGNOSIS — Z8742 Personal history of other diseases of the female genital tract: Secondary | ICD-10-CM | POA: Diagnosis not present

## 2016-09-03 DIAGNOSIS — M9901 Segmental and somatic dysfunction of cervical region: Secondary | ICD-10-CM | POA: Diagnosis not present

## 2016-09-03 DIAGNOSIS — M50322 Other cervical disc degeneration at C5-C6 level: Secondary | ICD-10-CM | POA: Diagnosis not present

## 2016-09-03 DIAGNOSIS — M9902 Segmental and somatic dysfunction of thoracic region: Secondary | ICD-10-CM | POA: Diagnosis not present

## 2016-09-03 DIAGNOSIS — M50323 Other cervical disc degeneration at C6-C7 level: Secondary | ICD-10-CM | POA: Diagnosis not present

## 2016-09-03 DIAGNOSIS — M50123 Cervical disc disorder at C6-C7 level with radiculopathy: Secondary | ICD-10-CM | POA: Diagnosis not present

## 2016-09-10 ENCOUNTER — Ambulatory Visit (INDEPENDENT_AMBULATORY_CARE_PROVIDER_SITE_OTHER): Payer: Medicare Other | Admitting: Neurology

## 2016-09-10 VITALS — BP 127/72 | HR 64 | Ht 65.0 in | Wt 125.5 lb

## 2016-09-10 DIAGNOSIS — Z86011 Personal history of benign neoplasm of the brain: Secondary | ICD-10-CM | POA: Diagnosis not present

## 2016-09-10 DIAGNOSIS — G40209 Localization-related (focal) (partial) symptomatic epilepsy and epileptic syndromes with complex partial seizures, not intractable, without status epilepticus: Secondary | ICD-10-CM | POA: Diagnosis not present

## 2016-09-10 DIAGNOSIS — F329 Major depressive disorder, single episode, unspecified: Secondary | ICD-10-CM

## 2016-09-10 DIAGNOSIS — F32A Depression, unspecified: Secondary | ICD-10-CM

## 2016-09-10 NOTE — Progress Notes (Signed)
PATIENT: Vickie Gross DOB: 17-Jul-1950  Chief Complaint  Patient presents with  . Follow-up    referring MD sinha, PCP [place], Seizures been managed     HISTORICAL  Vickie Gross is a 66 years old right-handed female, seen in refer by her previous neurologist Dr. [name] for evaluation of focal seizure, history of multiple meningioma, initial evaluation was on January 25 2016.  She had bilateral frontal meningioma resection in 1998, she presented with gradual onset memory loss, confusion, partial seizure, got lost while driving, post surgically, she had recurrent partial seizure, depression, was treated with different medications,  She also reported laser surgery for residual meningioma, she also had resection surgery later because of recurrent tumor, this was done at [facility].  She also had a yearly MRI follow-up by [facility]'s oncologist [name], was seen by neurologist [name] in our office in the past for partial seizure control, most recent neurological care was under [name],  who described different seizure types, event 1, hyperventilation, then lost consciousness, become stiff, lasting for 5 minutes, 2-3 times per year, event 2, zone out, eyes dilated, rolled back in her head, semi-rigid, foam coming out of mouth. Event 3, loss of consciousness without any warnings, eyes rolled back,  I have personally reviewed MRI of the brain with without contrast in January 2017, history of olfactory groove meningioma with extensive bilateral frontal gliosis, tissues in the left nasal cavity and on the right petrous ridge is predominantly ossified and stable since 2008,  Currently she is taking lamotrigine 150 mg twice a day, Keppra 750 mg 2 tablets twice a day since 2014,  She continues to have depression, she used to work as a Nurse, mental health, now on disability, husband was recently diagnosed with leukemia.  Lamictal has helped her  mood, she denies significant side effect from [place], she could not recall that she tried Depakote in the past.  I also personally reviewed outside records from [facility], multiple repeat MRIs most recent in 2016 showed no change, evidence of left over olfactory groove meningioma, extending into ethmoid sinus, stable bilateral frontal extensive encephalomalacia  UPDATE Jul 08 2016: I was able to review her [facility] record by neurosurgeon Dr. Laban Emperor, recurrent olfactory groove meningioma, she had resection in [place] in 1998, had a small posterior cavernous sinus meningioma that underwent gamma knife radiation in December of 1998, she had recurrent tumor again in 2008, which involving as point sinus, some reoccurrence intra-cranial, she underwent resection of her recurrent meningioma on December 29 2006. There was a small amount of residual tumor attached to the medial aspect of optic nerve, there is also small of tumor in the ethmoid sinus, she was later referred for radiation therapy, by [name], 50.4 Gy completed April 17, 2007, SRS to left anterior middle cranial fossa lesion 14 Gy 01/26/2013, last follow-up was on July 8 2 706, she supposed to have repeat MRI, continue follow-up on a yearly basis.  She denies recurrent seizure since last visit, but reported episode of sudden onset vertigo, lasting for a few minutes without loss of consciousness,  She is taking her antiemetic medication incorrectly, instead of taking lamotrigine 150 mg twice a day, Keppra 750 mg 2 tablets twice a day, she is only taking lamotrigine 150 mg/Keppra 750 mg every night, alternating 2 tablets of lamotrigine was 1 tablet of Keppra versus 2 tablets of Keppra with 1 tablet of lamotrigine,  I reviewed laboratory  evaluations, in January 2017, Keppra level was 16 point 8, lamotrigine level was 16 point 9, normal CMP, CBC,  She denied loss sense of smell, we personally reviewed MRI of the brain with and without  contrast in January 2017, there was evidence of bilateral frontal encephalomalacia, tissues in left nasal cavity and on the right patch was reached is stable compared to CT scanning 2008.  She also complains of difficulty with both vision, is not fully corrected by glasses.  Update September 10 2016: She is accompanied by her husband at today's clinical visit, We have personally reviewed MRI of the brain with and without contrast in June 2018, The sequelae of previous olfactory ridge meningioma post operative changes, large bilateral frontal encephalomalacia, right clinoid and petrous ridge enhancing meningioma, 2 dural based enhancing lesion at the frontal lobe, left mass was present in 2017, right mass was not clearly present with size of 10 times 5 mm,  She is now taking lamotrigine 150 mg twice a day, Depakote ER 500 mg every night, should have weaning off Keppra, she had very unsteady gait today, complains of dizziness, unsteady gait, blurry vision,  She has trouble to clarify the medications  REVIEW OF SYSTEMS: Full 14 system review of systems performed and notable only for hearing loss, double vision, blurry vision, insomnia, joint pain, walking difficulty, memory loss, dizziness, seizure, confusion, decreased concentration, depression  ALLERGIES: No Known Allergies  HOME MEDICATIONS: Current Outpatient Prescriptions  Medication Sig Dispense Refill  . divalproex (DEPAKOTE ER) 500 MG 24 hr tablet Take 1 tablet (500 mg total) by mouth at bedtime. 90 tablet 4  . ibuprofen (ADVIL,MOTRIN) 600 MG tablet Take 1 tablet (600 mg total) by mouth every 8 (eight) hours as needed for moderate pain or cramping. 30 tablet 0  . lamoTRIgine (LAMICTAL) 150 MG tablet Take 1 tablet (150 mg total) by mouth 2 (two) times daily. 180 tablet 4  . methocarbamol (ROBAXIN) 500 MG tablet Take 1 tablet (500 mg total) by mouth 2 (two) times daily. 20 tablet 0   No current facility-administered medications for this  visit.     PAST MEDICAL HISTORY: Past Medical History:  Diagnosis Date  . Brain tumor ([place])   . Breast tumor   . Focal seizures ([place])   . Meningiomas, multiple ([place])   . Vertigo     PAST SURGICAL HISTORY: Past Surgical History:  Procedure Laterality Date  . BRAIN SURGERY     x 4 (gamma knife, tumor removals, radiation)  . BREAST SURGERY      FAMILY HISTORY: Family History  Problem Relation Age of Onset  . Lung cancer Mother   . Heart disease Father   . Heart attack Father   . Diabetes Father   . Diabetes Sister   . Heart attack Sister   . Lung cancer Sister     SOCIAL HISTORY:  Social History   Social History  . Marital status: Married    Spouse name: N/A  . Number of children: 2  . Years of education: Bachelors   Occupational History  . Translator - part-time    Social History Main Topics  . Smoking status: Never Smoker  . Smokeless tobacco: Never Used  . Alcohol use No  . Drug use: No  . Sexual activity: Not on file   Other Topics Concern  . Not on file   Social History Narrative   Lives at home with her husband.   Right-handed.   2 cups caffeine per day.  PHYSICAL EXAM   Vitals:   09/10/16 0729  BP: 127/72  Pulse: 64  Weight: 125 lb 8 oz (56.9 kg)  Height: 5\' 5"  (1.651 m)    Not recorded      Body mass index is 20.88 kg/m.  PHYSICAL EXAMNIATION:  Gen: NAD, conversant, well nourised, obese, well groomed                     Cardiovascular: Regular rate rhythm, no peripheral edema, warm, nontender. Eyes: Conjunctivae clear without exudates or hemorrhage Neck: Supple, no carotid bruits. Pulmonary: Clear to auscultation bilaterally   NEUROLOGICAL EXAM:  MENTAL STATUS: Speech:    Speech is normal; fluent and spontaneous with normal comprehension.  Cognition:     Orientation to time, place and person     Normal recent and remote memory     Normal Attention span and concentration     Normal Language, naming,  repeating,spontaneous speech     Fund of knowledge   CRANIAL NERVES: CN II: Visual fields are full to confrontation. Fundoscopic exam is normal with sharp discs and no vascular changes.Visual acuity OD 20/70, OS 20/70 Pupils are round equal and briskly reactive to light. CN III, IV, VI: extraocular movement are normal. No ptosis. CN V: Facial sensation is intact to pinprick in all 3 divisions bilaterally. Corneal responses are intact.  CN VII: Face is symmetric with normal eye closure and smile. CN VIII: Hearing is normal to rubbing fingers CN IX, X: Palate elevates symmetrically. Phonation is normal. CN XI: Head turning and shoulder shrug are intact CN XII: Tongue is midline with normal movements and no atrophy.  MOTOR: There is no pronator drift of out-stretched arms. Muscle bulk and tone are normal. Muscle strength is normal.  REFLEXES: Reflexes are 2+ and symmetric at the biceps, triceps, knees, and ankles. Plantar responses are flexor.  SENSORY: Intact to light touch, pinprick, positional sensation and vibratory sensation are intact in fingers and toes.  COORDINATION:  There is no significant dysmetria on finger-to-nose and heel-knee-shin.    GAIT/STANCE: Trunk ataxia, unstable gait   DIAGNOSTIC DATA (LABS, IMAGING, TESTING) - I reviewed patient records, labs, notes, testing and imaging myself where available.   ASSESSMENT AND PLAN  SKYA MCCULLUM is a 66 y.o. female   History of large size olfactory groove meningioma  Status post frontal craniotomay, evidence of stable significant bilateral frontal encephalomalacia, continued evidence of stable olfactory groove meningioma, extending to right petrous ridge and left nasal cavity  I have suggested her continue yearly follow-up with neurosurgeon at [facility]   Complex partial seizure.  Check lamotrigine, Depakote, Keppra level  Repeat EEG  Keep lamotrigine 150 mg twice a day, and Depakote ER 500 mg every night   Decreased  bilateral vision  Refer her to ophthalmologist   Marcial Pacas, M.D. Ph.D.  [facility] [address] [place], [place] [address] Ph: [phone] Fax: [phone]  CC: [name], MD

## 2016-09-12 ENCOUNTER — Telehealth: Payer: Self-pay | Admitting: Neurology

## 2016-09-12 ENCOUNTER — Encounter: Payer: Self-pay | Admitting: *Deleted

## 2016-09-12 LAB — LEVETIRACETAM LEVEL: Levetiracetam Lvl: NOT DETECTED ug/mL (ref 10.0–40.0)

## 2016-09-12 LAB — COMPREHENSIVE METABOLIC PANEL
A/G RATIO: 2 (ref 1.2–2.2)
ALBUMIN: 4.8 g/dL (ref 3.6–4.8)
ALK PHOS: 45 IU/L (ref 39–117)
ALT: 9 IU/L (ref 0–32)
AST: 20 IU/L (ref 0–40)
BILIRUBIN TOTAL: 0.3 mg/dL (ref 0.0–1.2)
BUN / CREAT RATIO: 15 (ref 12–28)
BUN: 13 mg/dL (ref 8–27)
CHLORIDE: 99 mmol/L (ref 96–106)
CO2: 25 mmol/L (ref 20–29)
Calcium: 9.4 mg/dL (ref 8.7–10.3)
Creatinine, Ser: 0.88 mg/dL (ref 0.57–1.00)
GFR calc Af Amer: 80 mL/min/{1.73_m2} (ref >59)
GFR calc non Af Amer: 70 mL/min/{1.73_m2} (ref >59)
GLOBULIN, TOTAL: 2.4 g/dL (ref 1.5–4.5)
GLUCOSE: 80 mg/dL (ref 65–99)
POTASSIUM: 4.6 mmol/L (ref 3.5–5.2)
SODIUM: 142 mmol/L (ref 134–144)
Total Protein: 7.2 g/dL (ref 6.0–8.5)

## 2016-09-12 LAB — VALPROIC ACID LEVEL: Valproic Acid Lvl: 80 ug/mL (ref 50–100)

## 2016-09-12 LAB — CBC
HEMOGLOBIN: 12.1 g/dL (ref 11.1–15.9)
Hematocrit: 38.9 % (ref 34.0–46.6)
MCH: 26.8 pg (ref 26.6–33.0)
MCHC: 31.1 g/dL — AB (ref 31.5–35.7)
MCV: 86 fL (ref 79–97)
Platelets: 225 10*3/uL (ref 150–379)
RBC: 4.51 x10E6/uL (ref 3.77–5.28)
RDW: 15.1 % (ref 12.3–15.4)
WBC: 5.3 10*3/uL (ref 3.4–10.8)

## 2016-09-12 LAB — LAMOTRIGINE LEVEL: Lamotrigine Lvl: 23.8 ug/mL (ref 2.0–20.0)

## 2016-09-12 NOTE — Telephone Encounter (Signed)
Pt husband calling to inform that pt is still experiencing vertigo/dizzyness for over a week.  He is asking for results of blood work and what he should do.  Pt husband said that he is making sure pt takes her medication daily, please call.

## 2016-09-12 NOTE — Telephone Encounter (Addendum)
Returned call - spoke to patient and husband.  Patient's Lamictal level was elevated and per vo by Dr. Krista Blue, she would like for her to reduce dose of Lamictal to 75mg  in am and 150mg  in pm (patient was taking 150mg , BID).  [place] lab pending.  They verbalized understanding and will make the dose adjustment.

## 2016-09-25 ENCOUNTER — Other Ambulatory Visit: Payer: Medicare Other

## 2016-10-11 DIAGNOSIS — R0781 Pleurodynia: Secondary | ICD-10-CM | POA: Diagnosis not present

## 2016-10-11 DIAGNOSIS — R87610 Atypical squamous cells of undetermined significance on cytologic smear of cervix (ASC-US): Secondary | ICD-10-CM | POA: Insufficient documentation

## 2016-10-11 DIAGNOSIS — N952 Postmenopausal atrophic vaginitis: Secondary | ICD-10-CM | POA: Insufficient documentation

## 2016-11-25 ENCOUNTER — Ambulatory Visit: Payer: Medicare Other | Admitting: Neurology

## 2017-01-07 ENCOUNTER — Encounter: Payer: Self-pay | Admitting: Neurology

## 2017-01-07 ENCOUNTER — Ambulatory Visit (INDEPENDENT_AMBULATORY_CARE_PROVIDER_SITE_OTHER): Payer: Medicare Other | Admitting: Neurology

## 2017-01-07 VITALS — BP 128/71 | HR 73 | Ht 65.0 in | Wt 124.5 lb

## 2017-01-07 DIAGNOSIS — D329 Benign neoplasm of meninges, unspecified: Secondary | ICD-10-CM

## 2017-01-07 DIAGNOSIS — G40209 Localization-related (focal) (partial) symptomatic epilepsy and epileptic syndromes with complex partial seizures, not intractable, without status epilepticus: Secondary | ICD-10-CM

## 2017-01-07 MED ORDER — LAMOTRIGINE 150 MG PO TABS
ORAL_TABLET | ORAL | 4 refills | Status: DC
Start: 2017-01-07 — End: 2018-03-02

## 2017-01-07 MED ORDER — DIVALPROEX SODIUM ER 500 MG PO TB24: 500.0000 mg | ORAL_TABLET | Freq: Every day | ORAL | 4 refills | Status: DC

## 2017-01-07 NOTE — Progress Notes (Signed)
PATIENT: Vickie Gross DOB: 08-09-1950  Chief Complaint  Patient presents with  . Meningioma/Seizures    She is here for her routine follow up with no new concerns.  Denies any seizure activity.     HISTORICAL  Vickie Gross is a 66 years old right-handed female, seen in refer by her previous neurologist Dr. [name] for evaluation of focal seizure, history of multiple meningioma, initial evaluation was on January 25 2016.  She had bilateral frontal meningioma resection in 1998, she presented with gradual onset memory loss, confusion, partial seizure, got lost while driving, post surgically, she had recurrent partial seizure, depression, was treated with different medications,  She also reported laser surgery for residual meningioma, she also had resection surgery later because of recurrent tumor, this was done at [facility].  She also had a yearly MRI follow-up by [facility]'s oncologist [name], was seen by neurologist [name] in our office in the past for partial seizure control, most recent neurological care was under [name],  who described different seizure types, event 1, hyperventilation, then lost consciousness, become stiff, lasting for 5 minutes, 2-3 times per year, event 2, zone out, eyes dilated, rolled back in her head, semi-rigid, foam coming out of mouth. Event 3, loss of consciousness without any warnings, eyes rolled back,  I have personally reviewed MRI of the brain with without contrast in January 2017, history of olfactory groove meningioma with extensive bilateral frontal gliosis, tissues in the left nasal cavity and on the right petrous ridge is predominantly ossified and stable since 2008,  Currently she is taking lamotrigine 150 mg twice a day, Keppra 750 mg 2 tablets twice a day since 2014,  She continues to have depression, she used to work as a Nurse, mental health, now on disability, husband was recently  diagnosed with leukemia.  Lamictal has helped her mood, she denies significant side effect from [place], she could not recall that she tried Depakote in the past.  I also personally reviewed outside records from [facility], multiple repeat MRIs most recent in 2016 showed no change, evidence of left over olfactory groove meningioma, extending into ethmoid sinus, stable bilateral frontal extensive encephalomalacia  UPDATE Jul 08 2016: I was able to review her [facility] record by neurosurgeon Dr. Laban Emperor, recurrent olfactory groove meningioma, she had resection in [place] in 1998, had a small posterior cavernous sinus meningioma that underwent gamma knife radiation in December of 1998, she had recurrent tumor again in 2008, which involving as point sinus, some reoccurrence intra-cranial, she underwent resection of her recurrent meningioma on December 29 2006. There was a small amount of residual tumor attached to the medial aspect of optic nerve, there is also small of tumor in the ethmoid sinus, she was later referred for radiation therapy, by [name], 50.4 Gy completed April 17, 2007, SRS to left anterior middle cranial fossa lesion 14 Gy 01/26/2013, last follow-up was on July 8 2 706, she supposed to have repeat MRI, continue follow-up on a yearly basis.  She denies recurrent seizure since last visit, but reported episode of sudden onset vertigo, lasting for a few minutes without loss of consciousness,  She is taking her antiemetic medication incorrectly, instead of taking lamotrigine 150 mg twice a day, Keppra 750 mg 2 tablets twice a day, she is only taking lamotrigine 150 mg/Keppra 750 mg every night, alternating 2 tablets of lamotrigine was 1 tablet of Keppra versus 2 tablets of Keppra  with 1 tablet of lamotrigine,  I reviewed laboratory evaluations, in January 2017, Keppra level was 16 point 8, lamotrigine level was 16 point 9, normal CMP, CBC,  She denied loss sense of smell, we  personally reviewed MRI of the brain with and without contrast in January 2017, there was evidence of bilateral frontal encephalomalacia, tissues in left nasal cavity and on the right patch was reached is stable compared to CT scanning 2008.  She also complains of difficulty with both vision, is not fully corrected by glasses.  Update September 10 2016: She is accompanied by her husband at today's clinical visit, We have personally reviewed MRI of the brain with and without contrast in June 2018, The sequelae of previous olfactory ridge meningioma post operative changes, large bilateral frontal encephalomalacia, right clinoid and petrous ridge enhancing meningioma, 2 dural based enhancing lesion at the frontal lobe, left mass was present in 2017, right mass was not clearly present with size of 10 times 5 mm,  She is now taking lamotrigine 150 mg twice a day, Depakote ER 500 mg every night, should have weaning off Keppra, she had very unsteady gait today, complains of dizziness, unsteady gait, blurry vision,  She has trouble to clarify the medications  UPDATE Jan 07 2017: September 10 2016 lamotrigine level was elevated 23.8, Depakote level 80, Keppra level was not detectable  She is now taking lamotrigine 150 mg tablets, half in the morning, 1 at night, Depakote ER 500 mg every night, she has no recurrent seizure.  Her husband is recently diagnosed with leukemia, she noticed blurry vision, left eye pain, occasionally headaches  REVIEW OF SYSTEMS: Full 14 system review of systems performed and notable only for eye pain, headaches  ALLERGIES: No Known Allergies  HOME MEDICATIONS: Current Outpatient Medications  Medication Sig Dispense Refill  . divalproex (DEPAKOTE ER) 500 MG 24 hr tablet Take 1 tablet (500 mg total) by mouth at bedtime. 90 tablet 4  . lamoTRIgine (LAMICTAL) 150 MG tablet Take 1 tablet (150 mg total) by mouth 2 (two) times daily. 180 tablet 4   No current facility-administered  medications for this visit.     PAST MEDICAL HISTORY: Past Medical History:  Diagnosis Date  . Brain tumor ([place])   . Breast tumor   . Focal seizures ([place])   . Meningiomas, multiple ([place])   . Vertigo     PAST SURGICAL HISTORY: Past Surgical History:  Procedure Laterality Date  . BRAIN SURGERY     x 4 (gamma knife, tumor removals, radiation)  . BREAST SURGERY      FAMILY HISTORY: Family History  Problem Relation Age of Onset  . Lung cancer Mother   . Heart disease Father   . Heart attack Father   . Diabetes Father   . Diabetes Sister   . Heart attack Sister   . Lung cancer Sister     SOCIAL HISTORY:  Social History   Socioeconomic History  . Marital status: Married    Spouse name: Not on file  . Number of children: 2  . Years of education: Bachelors  . Highest education level: Not on file  Social Needs  . Financial resource strain: Not on file  . Food insecurity - worry: Not on file  . Food insecurity - inability: Not on file  . Transportation needs - medical: Not on file  . Transportation needs - non-medical: Not on file  Occupational History  . Occupation: Optometrist - part-time  Tobacco Use  .  Smoking status: Never Smoker  . Smokeless tobacco: Never Used  Substance and Sexual Activity  . Alcohol use: No  . Drug use: No  . Sexual activity: Not on file  Other Topics Concern  . Not on file  Social History Narrative   Lives at home with her husband.   Right-handed.   2 cups caffeine per day.     PHYSICAL EXAM   Vitals:   01/07/17 0800  BP: 128/71  Pulse: 73  Weight: 124 lb 8 oz (56.5 kg)  Height: 5\' 5"  (1.651 m)    Not recorded      Body mass index is 20.72 kg/m.  PHYSICAL EXAMNIATION:  Gen: NAD, conversant, well nourised, obese, well groomed                     Cardiovascular: Regular rate rhythm, no peripheral edema, warm, nontender. Eyes: Conjunctivae clear without exudates or hemorrhage Neck: Supple, no carotid  bruits. Pulmonary: Clear to auscultation bilaterally   NEUROLOGICAL EXAM:  MENTAL STATUS: Speech:    Speech is normal; fluent and spontaneous with normal comprehension.  Cognition:     Orientation to time, place and person     Normal recent and remote memory     Normal Attention span and concentration     Normal Language, naming, repeating,spontaneous speech     Fund of knowledge   CRANIAL NERVES: CN II: Visual fields are full to confrontation. Fundoscopic exam is normal with sharp discs and no vascular changes.Visual acuity OD 20/70, OS 20/70 Pupils are round equal and briskly reactive to light. CN III, IV, VI: extraocular movement are normal. No ptosis. CN V: Facial sensation is intact to pinprick in all 3 divisions bilaterally. Corneal responses are intact.  CN VII: Face is symmetric with normal eye closure and smile. CN VIII: Hearing is normal to rubbing fingers CN IX, X: Palate elevates symmetrically. Phonation is normal. CN XI: Head turning and shoulder shrug are intact CN XII: Tongue is midline with normal movements and no atrophy.  MOTOR: There is no pronator drift of out-stretched arms. Muscle bulk and tone are normal. Muscle strength is normal.  REFLEXES: Reflexes are 2+ and symmetric at the biceps, triceps, knees, and ankles. Plantar responses are flexor.  SENSORY: Intact to light touch, pinprick, positional sensation and vibratory sensation are intact in fingers and toes.  COORDINATION:  There is no significant dysmetria on finger-to-nose and heel-knee-shin.    GAIT/STANCE: Trunk ataxia, unstable gait   DIAGNOSTIC DATA (LABS, IMAGING, TESTING) - I reviewed patient records, labs, notes, testing and imaging myself where available.   ASSESSMENT AND PLAN  JEANNETTA CERUTTI is a 66 y.o. female   History of large size olfactory groove meningioma  Status post frontal craniotomay, evidence of stable significant bilateral frontal encephalomalacia, continued evidence of  stable olfactory groove meningioma, extending to right petrous ridge and left nasal cavity  I have suggested her continue yearly follow-up with neurosurgeon at [facility]   Complex partial seizure.  Check lamotrigine, Depakote, Keppra level  Repeat EEG  Keep lamotrigine 150 mg half tablet in the morning, 1 at night: and Depakote ER 500 mg every night   Decreased bilateral vision  Refer her to ophthalmologist   Marcial Pacas, M.D. Ph.D.  [facility] [address] [place], [place] [address] Ph: [phone] Fax: [phone]  CC: [name], MD

## 2017-01-10 DIAGNOSIS — L905 Scar conditions and fibrosis of skin: Secondary | ICD-10-CM | POA: Diagnosis not present

## 2017-01-10 DIAGNOSIS — L821 Other seborrheic keratosis: Secondary | ICD-10-CM | POA: Diagnosis not present

## 2017-01-10 DIAGNOSIS — L814 Other melanin hyperpigmentation: Secondary | ICD-10-CM | POA: Diagnosis not present

## 2017-01-30 DIAGNOSIS — Z23 Encounter for immunization: Secondary | ICD-10-CM | POA: Diagnosis not present

## 2017-02-05 ENCOUNTER — Emergency Department (HOSPITAL_COMMUNITY)
Admission: EM | Admit: 2017-02-05 | Discharge: 2017-02-05 | Disposition: A | Discharge status: Home / Self Care | Payer: Medicare Other | Attending: Emergency Medicine | Admitting: Emergency Medicine

## 2017-02-05 ENCOUNTER — Emergency Department (HOSPITAL_COMMUNITY): Payer: Medicare Other

## 2017-02-05 ENCOUNTER — Encounter (HOSPITAL_COMMUNITY): Payer: Self-pay

## 2017-02-05 ENCOUNTER — Other Ambulatory Visit: Payer: Self-pay

## 2017-02-05 DIAGNOSIS — Z79899 Other long term (current) drug therapy: Secondary | ICD-10-CM | POA: Diagnosis not present

## 2017-02-05 DIAGNOSIS — T148XXA Other injury of unspecified body region, initial encounter: Secondary | ICD-10-CM

## 2017-02-05 DIAGNOSIS — M542 Cervicalgia: Secondary | ICD-10-CM | POA: Diagnosis not present

## 2017-02-05 DIAGNOSIS — Y999 Unspecified external cause status: Secondary | ICD-10-CM | POA: Insufficient documentation

## 2017-02-05 DIAGNOSIS — S199XXA Unspecified injury of neck, initial encounter: Secondary | ICD-10-CM | POA: Diagnosis not present

## 2017-02-05 DIAGNOSIS — S0990XA Unspecified injury of head, initial encounter: Secondary | ICD-10-CM | POA: Insufficient documentation

## 2017-02-05 DIAGNOSIS — S0003XA Contusion of scalp, initial encounter: Secondary | ICD-10-CM | POA: Diagnosis not present

## 2017-02-05 DIAGNOSIS — S064X0A Epidural hemorrhage without loss of consciousness, initial encounter: Secondary | ICD-10-CM | POA: Diagnosis not present

## 2017-02-05 DIAGNOSIS — Y9389 Activity, other specified: Secondary | ICD-10-CM | POA: Diagnosis not present

## 2017-02-05 DIAGNOSIS — Y929 Unspecified place or not applicable: Secondary | ICD-10-CM | POA: Insufficient documentation

## 2017-02-05 DIAGNOSIS — W0110XA Fall on same level from slipping, tripping and stumbling with subsequent striking against unspecified object, initial encounter: Secondary | ICD-10-CM | POA: Insufficient documentation

## 2017-02-05 MED ORDER — FENTANYL CITRATE (PF) 100 MCG/2ML IJ SOLN
100.0000 ug | Freq: Once | INTRAMUSCULAR | Status: DC
  Filled 2017-02-05: qty 2

## 2017-02-05 MED ORDER — OXYCODONE-ACETAMINOPHEN 5-325 MG PO TABS
2.0000 | ORAL_TABLET | Freq: Once | ORAL | Status: AC
  Administered 2017-02-05: 2 via ORAL
  Filled 2017-02-05: qty 2

## 2017-02-05 NOTE — ED Notes (Signed)
EDP at bedside  

## 2017-02-05 NOTE — Discharge Instructions (Signed)
Follow-up with your primary care doctor in the next 24-48 hours for further evaluation.   You can take Tylenol or Ibuprofen as directed for pain. You can alternate Tylenol and Ibuprofen every 4 hours. If you take Tylenol at 1pm, then you can take Ibuprofen at 5pm. Then you can take Tylenol again at 9pm.    As we discussed apply ice to the area.   Return to the Emergency Department for any worsening headache, vision changes, numbness/weakness of your arms or legs, vomiting, difficulty walking or any other worsening or concerning symptoms.

## 2017-02-05 NOTE — ED Triage Notes (Signed)
Pt arrives via EMS from home after falling backwards while playing with dog. Pt has golf ball sized hematoma to right occipital region. Denies blood thinners. Denies neck or back pain. Pt endorses hx of brain tumors.   110/74 rr 16 Hr 70 cbg 86

## 2017-02-05 NOTE — ED Notes (Signed)
Patient transported to CT 

## 2017-02-05 NOTE — ED Provider Notes (Signed)
[facility] EMERGENCY DEPARTMENT Provider Note   CSN: [id number] Arrival date & time: 02/05/17  1144     History   Chief Complaint Chief Complaint  Patient presents with  . Fall  . Head Injury    HPI Vickie Gross is a 66 y.o. female with PMH/o Brain tumor presents for evaluation of head injury after mechanical fall.  Patient states that she was at home at approximately 11 AM playing with her dog when she tripped over a toy, causing her to fall backward and hit her head on the corner of the wall.  Patient reports that since then she has had some swelling to the posterior aspect of the scalp.  Husband was at home during the fall and immediately heard her fall.  He came immediately and states that she was responsive but did not have any LOC.  Patient is on any blood thinners.  Patient initially denied any neck pain or back pain.  She has been able to ambulate since.  Patient denies any chest pain, difficulty breathing, numbness/weakness of her arms or legs.  The history is provided by the spouse.    Past Medical History:  Diagnosis Date  . Brain tumor ([place])   . Breast tumor   . Focal seizures ([place])   . Meningiomas, multiple ([place])   . Vertigo     Patient Active Problem List   Diagnosis Date Noted  . Meningioma ([place]) 01/07/2017  . Partial symptomatic epilepsy with complex partial seizures, not intractable, without status epilepticus ([place]) 01/07/2017  . Hx of meningioma of the brain 01/25/2016  . HYPERLIPIDEMIA 01/04/2008  . Depression 01/04/2008  . Complex partial seizure ([place]) 01/04/2008    Past Surgical History:  Procedure Laterality Date  . BRAIN SURGERY     x 4 (gamma knife, tumor removals, radiation)  . BREAST SURGERY      OB History    No data available       Home Medications    Prior to Admission medications   Medication Sig Start Date End Date Taking? Authorizing Provider  divalproex (DEPAKOTE ER) 500 MG 24 hr tablet Take 1 tablet (500  mg total) at bedtime by mouth. 01/07/17   Marcial Pacas, MD  lamoTRIgine (LAMICTAL) 150 MG tablet 1/2 tab in the morning, one at night. 01/07/17   Marcial Pacas, MD    Family History Family History  Problem Relation Age of Onset  . Lung cancer Mother   . Heart disease Father   . Heart attack Father   . Diabetes Father   . Diabetes Sister   . Heart attack Sister   . Lung cancer Sister     Social History Social History   Tobacco Use  . Smoking status: Never Smoker  . Smokeless tobacco: Never Used  Substance Use Topics  . Alcohol use: No  . Drug use: No     Allergies   Patient has no known allergies.   Review of Systems Review of Systems  Respiratory: Negative for cough and shortness of breath.   Cardiovascular: Negative for chest pain.  Gastrointestinal: Negative for abdominal pain, nausea and vomiting.  Neurological: Positive for headaches. Negative for weakness and numbness.     Physical Exam Updated Vital Signs BP 131/76 (BP Location: Left Arm)   Pulse 67   Temp 98.4 F (36.9 C) (Oral)   Resp 18   LMP 03/11/1994   SpO2 100%   Physical Exam  Constitutional: She is oriented to person, place,  and time. She appears well-developed and well-nourished.  HENT:  Head: Normocephalic. Head is without raccoon's eyes and without Battle's sign.  Mouth/Throat: Oropharynx is clear and moist and mucous membranes are normal.  Hematoma noted to the posterior region of the scalp.  No laceration, abrasion noted.  No skull deformity or crepitus.  Eyes: Conjunctivae, EOM and lids are normal. Pupils are equal, round, and reactive to light.  Neck:  ROM intact but with subjective reports of "soreness." No deformity or crepitus.   Cardiovascular: Normal rate, regular rhythm, normal heart sounds and normal pulses. Exam reveals no gallop and no friction rub.  No murmur heard. Pulmonary/Chest: Effort normal and breath sounds normal.  Abdominal: Soft. Normal appearance. There is no  tenderness. There is no rigidity and no guarding.  Musculoskeletal: Normal range of motion.  Neurological: She is alert and oriented to person, place, and time.  Follows commands, Moves all extremities  5/5 strength to BUE and BLE  Sensation intact throughout all major nerve distributions  Skin: Skin is warm and dry. Capillary refill takes less than 2 seconds.  Psychiatric: She has a normal mood and affect. Her speech is normal.  Nursing note and vitals reviewed.    ED Treatments / Results  Labs (all labs ordered are listed, but only abnormal results are displayed) Labs Reviewed - No data to display  EKG  EKG Interpretation None       Radiology Ct Head Wo Contrast  Result Date: 02/05/2017 CLINICAL DATA:  66 year old female status post fall striking back of the head. Posterior headache. Personal history of treated olfactory groove meningioma. EXAM: CT HEAD WITHOUT CONTRAST TECHNIQUE: Contiguous axial images were obtained from the base of the skull through the vertex without intravenous contrast. COMPARISON:  Brain MRI 07/28/2016 and earlier.  Head CT 02/28/2015. FINDINGS: Brain: Chronic inferior bifrontal encephalomalacia with mild ex vacuo enlargement of the frontal horns and lateral ventricles. Stable cerebral volume since 2017. No intracranial mass effect. No acute intracranial hemorrhage identified. No cortically based acute infarct identified. Stable gray-white matter differentiation throughout the brain. Semi circular partially calcified extra-axial mass about the posterior right clinoid process (series 6, image 26) appears stable since 2017. Vascular: No suspicious intracranial vascular hyperdensity. Skull: Sequelae of bifrontal craniotomy and cranioplasty. No skull fracture or acute osseous abnormality identified. Sinuses/Orbits: Stable since 2017. Tympanic cavities and mastoids are clear. Frontal, sphenoid, and visible maxillary sinuses are clear. There is lobulated material in  the posterior ethmoid air cells and the olfactory recesses, greater on the left (series 4, image 11) which is stable since 2017. Other: Stable orbits soft tissues. Lobulated right posterior scalp hematoma measuring up to 12 mm in thickness. No subcutaneous gas. Underlying calvarium appears intact. Other scalp soft tissues appear stable, including postoperative changes related to bifrontal craniotomy anteriorly. IMPRESSION: 1. Posterior scalp hematoma without underlying skull fracture. 2. No acute intracranial abnormality or acute traumatic injury to the brain identified. 3. Stable post treatment CT appearance of the brain since 2017. Postoperative changes to the anterior frontal convexity and anterior cranial fossa with bilateral inferior frontal gyrus encephalomalacia, chronic soft tissue at the olfactory recesses, and chronic partially calcified right posterior clinoid process mass. Electronically Signed   By: Genevie Ann M.D.   On: 02/05/2017 13:14   Ct Cervical Spine Wo Contrast  Result Date: 02/05/2017 CLINICAL DATA:  Neck pain.  Fell backwards.  Scalp hematoma. EXAM: CT CERVICAL SPINE WITHOUT CONTRAST TECHNIQUE: Multidetector CT imaging of the cervical spine was performed without intravenous  contrast. Multiplanar CT image reconstructions were also generated. COMPARISON:  CT of the head was performed earlier in the day. FINDINGS: Alignment: Normal. Skull base and vertebrae: No acute fracture. No primary bone lesion or focal pathologic process. Soft tissues and spinal canal: No prevertebral fluid or swelling. No visible canal hematoma. Disc levels: Mild disc space narrowing at C6-7 with osseous spurring. Upper chest: No pneumothorax or mass. Other: None. IMPRESSION: No cervical spine fracture or traumatic subluxation. Electronically Signed   By: Staci Righter M.D.   On: 02/05/2017 18:24    Procedures Procedures (including critical care time)  Medications Ordered in ED Medications    oxyCODONE-acetaminophen (PERCOCET/ROXICET) 5-325 MG per tablet 2 tablet (2 tablets Oral Given 02/05/17 1743)     Initial Impression / Assessment and Plan / ED Course  I have reviewed the triage vital signs and the nursing notes.  Pertinent labs & imaging results that were available during my care of the patient were reviewed by me and considered in my medical decision making (see chart for details).     66 year old female who presents for evaluation of head injury after mechanical fall that occurred today at 11 AM.  Patient reports that she tripped over a dog toy, causing her to fall backwards and hit her posterior scalp against the wall.  Patient denies any LOC.  She is on any blood thinners.  Husband was able to get the patient immediately after the incident and denies any LOC.  Patient has been ambulatory since.  On ED arrival, complaining of posterior head pain.  Initially on triage arrival patient denied any neck or back pain.  Initial head CT ordered at triage.  Head CT reviewed.  No evidence of intracranial hemorrhage, skull fracture.  Stable posttreatment CT appearance of the brain since 2017.  There is noted postoperative changes.  Discussed results with patient.  Initially patient denied any neck pain.  On my exam, patient states that the neck feels sore.  She has limited range of motion secondary to neck soreness.  Given findings, will plan CT cervical spine. Analgesics provided in the department.  CT C-spine reviewed.  Negative for any acute fracture dislocation.  Repeat vitals stable.  Discussed results with patient and husband.  Patient ambulated in the department without any difficulty.  Stable for discharge at this time.  Discussed minor head injury supportive treatment.  Instructed patient to follow-up with her primary care doctor in the next 24-48 hours. Patient had ample opportunity for questions and discussion. All patient's questions were answered with full understanding. Strict  return precautions discussed. Patient expresses understanding and agreement to plan.    Final Clinical Impressions(s) / ED Diagnoses   Final diagnoses:  Injury of head, initial encounter  Hematoma    ED Discharge Orders    None       Desma Mcgregor 02/05/17 2109    Virgel Manifold, MD 02/11/17 [phone]

## 2017-02-05 NOTE — ED Notes (Signed)
Patient verbalized understanding of discharge instructions and denies any further needs or questions at this time. VS stable. Patient ambulatory with steady gait. Assisted to ED entrance in wheelchair.   

## 2017-03-05 ENCOUNTER — Telehealth: Payer: Self-pay | Admitting: Neurology

## 2017-03-05 NOTE — Telephone Encounter (Signed)
Dr. Katy Fitch , has tried to call the patient a few times and patient said she did not want to schedule at this time patient will call back at a later date.

## 2017-05-02 DIAGNOSIS — W19XXXA Unspecified fall, initial encounter: Secondary | ICD-10-CM | POA: Diagnosis not present

## 2017-05-02 DIAGNOSIS — M7032 Other bursitis of elbow, left elbow: Secondary | ICD-10-CM | POA: Diagnosis not present

## 2017-05-02 DIAGNOSIS — L0100 Impetigo, unspecified: Secondary | ICD-10-CM | POA: Diagnosis not present

## 2017-05-02 DIAGNOSIS — M25512 Pain in left shoulder: Secondary | ICD-10-CM | POA: Diagnosis not present

## 2017-06-20 ENCOUNTER — Ambulatory Visit: Payer: Medicare Other | Admitting: Family Medicine

## 2017-07-08 ENCOUNTER — Ambulatory Visit: Payer: Medicare Other | Admitting: Neurology

## 2017-08-18 DIAGNOSIS — D329 Benign neoplasm of meninges, unspecified: Secondary | ICD-10-CM | POA: Diagnosis not present

## 2017-08-18 DIAGNOSIS — Z0000 Encounter for general adult medical examination without abnormal findings: Secondary | ICD-10-CM | POA: Diagnosis not present

## 2017-08-18 DIAGNOSIS — Z1159 Encounter for screening for other viral diseases: Secondary | ICD-10-CM | POA: Diagnosis not present

## 2017-08-18 DIAGNOSIS — Z021 Encounter for pre-employment examination: Secondary | ICD-10-CM | POA: Diagnosis not present

## 2017-08-18 DIAGNOSIS — F3342 Major depressive disorder, recurrent, in full remission: Secondary | ICD-10-CM | POA: Diagnosis not present

## 2017-08-18 DIAGNOSIS — G40209 Localization-related (focal) (partial) symptomatic epilepsy and epileptic syndromes with complex partial seizures, not intractable, without status epilepticus: Secondary | ICD-10-CM | POA: Diagnosis not present

## 2017-08-25 DIAGNOSIS — D329 Benign neoplasm of meninges, unspecified: Secondary | ICD-10-CM | POA: Diagnosis not present

## 2017-08-25 DIAGNOSIS — G40209 Localization-related (focal) (partial) symptomatic epilepsy and epileptic syndromes with complex partial seizures, not intractable, without status epilepticus: Secondary | ICD-10-CM | POA: Diagnosis not present

## 2017-08-25 DIAGNOSIS — Z0000 Encounter for general adult medical examination without abnormal findings: Secondary | ICD-10-CM | POA: Diagnosis not present

## 2017-08-25 DIAGNOSIS — Z136 Encounter for screening for cardiovascular disorders: Secondary | ICD-10-CM | POA: Diagnosis not present

## 2017-08-25 DIAGNOSIS — Z021 Encounter for pre-employment examination: Secondary | ICD-10-CM | POA: Diagnosis not present

## 2017-08-25 DIAGNOSIS — F3342 Major depressive disorder, recurrent, in full remission: Secondary | ICD-10-CM | POA: Diagnosis not present

## 2017-08-25 DIAGNOSIS — Z1159 Encounter for screening for other viral diseases: Secondary | ICD-10-CM | POA: Diagnosis not present

## 2017-10-16 DIAGNOSIS — Z01419 Encounter for gynecological examination (general) (routine) without abnormal findings: Secondary | ICD-10-CM | POA: Diagnosis not present

## 2017-10-16 DIAGNOSIS — Z1231 Encounter for screening mammogram for malignant neoplasm of breast: Secondary | ICD-10-CM | POA: Diagnosis not present

## 2017-10-16 DIAGNOSIS — Z8742 Personal history of other diseases of the female genital tract: Secondary | ICD-10-CM | POA: Diagnosis not present

## 2017-10-16 DIAGNOSIS — Z124 Encounter for screening for malignant neoplasm of cervix: Secondary | ICD-10-CM | POA: Diagnosis not present

## 2017-10-22 DIAGNOSIS — Z79899 Other long term (current) drug therapy: Secondary | ICD-10-CM | POA: Diagnosis not present

## 2017-10-22 DIAGNOSIS — T542X1A Toxic effect of corrosive acids and acid-like substances, accidental (unintentional), initial encounter: Secondary | ICD-10-CM | POA: Diagnosis not present

## 2017-10-22 DIAGNOSIS — E785 Hyperlipidemia, unspecified: Secondary | ICD-10-CM | POA: Diagnosis not present

## 2017-10-22 DIAGNOSIS — S0502XA Injury of conjunctiva and corneal abrasion without foreign body, left eye, initial encounter: Secondary | ICD-10-CM | POA: Diagnosis not present

## 2017-10-22 DIAGNOSIS — R569 Unspecified convulsions: Secondary | ICD-10-CM | POA: Diagnosis not present

## 2017-10-22 DIAGNOSIS — T2662XA Corrosion of cornea and conjunctival sac, left eye, initial encounter: Secondary | ICD-10-CM | POA: Diagnosis not present

## 2017-10-22 DIAGNOSIS — T2612XA Burn of cornea and conjunctival sac, left eye, initial encounter: Secondary | ICD-10-CM | POA: Diagnosis not present

## 2017-10-22 DIAGNOSIS — D496 Neoplasm of unspecified behavior of brain: Secondary | ICD-10-CM | POA: Diagnosis not present

## 2017-10-22 DIAGNOSIS — T495X4A Poisoning by ophthalmological drugs and preparations, undetermined, initial encounter: Secondary | ICD-10-CM | POA: Diagnosis not present

## 2017-11-02 DIAGNOSIS — R072 Precordial pain: Secondary | ICD-10-CM | POA: Diagnosis not present

## 2017-11-02 DIAGNOSIS — E785 Hyperlipidemia, unspecified: Secondary | ICD-10-CM | POA: Diagnosis not present

## 2017-11-02 DIAGNOSIS — R079 Chest pain, unspecified: Secondary | ICD-10-CM | POA: Diagnosis not present

## 2017-11-02 DIAGNOSIS — R569 Unspecified convulsions: Secondary | ICD-10-CM | POA: Diagnosis not present

## 2017-11-02 DIAGNOSIS — F418 Other specified anxiety disorders: Secondary | ICD-10-CM | POA: Diagnosis not present

## 2017-11-02 DIAGNOSIS — R0602 Shortness of breath: Secondary | ICD-10-CM | POA: Diagnosis not present

## 2017-11-02 DIAGNOSIS — R911 Solitary pulmonary nodule: Secondary | ICD-10-CM | POA: Diagnosis not present

## 2017-11-02 DIAGNOSIS — R918 Other nonspecific abnormal finding of lung field: Secondary | ICD-10-CM | POA: Diagnosis not present

## 2017-11-02 DIAGNOSIS — R0789 Other chest pain: Secondary | ICD-10-CM | POA: Diagnosis not present

## 2017-11-02 DIAGNOSIS — Z79899 Other long term (current) drug therapy: Secondary | ICD-10-CM | POA: Diagnosis not present

## 2017-11-02 DIAGNOSIS — R9431 Abnormal electrocardiogram [ECG] [EKG]: Secondary | ICD-10-CM | POA: Diagnosis not present

## 2017-11-02 DIAGNOSIS — J9811 Atelectasis: Secondary | ICD-10-CM | POA: Diagnosis not present

## 2017-11-03 DIAGNOSIS — L659 Nonscarring hair loss, unspecified: Secondary | ICD-10-CM | POA: Diagnosis not present

## 2017-11-12 DIAGNOSIS — H43813 Vitreous degeneration, bilateral: Secondary | ICD-10-CM | POA: Diagnosis not present

## 2017-11-14 DIAGNOSIS — Z23 Encounter for immunization: Secondary | ICD-10-CM | POA: Diagnosis not present

## 2018-02-04 ENCOUNTER — Other Ambulatory Visit: Payer: Self-pay | Admitting: Neurology

## 2018-03-02 ENCOUNTER — Telehealth: Payer: Self-pay | Admitting: Neurology

## 2018-03-02 MED ORDER — LAMOTRIGINE 150 MG PO TABS: ORAL_TABLET | ORAL | 0 refills | Status: DC

## 2018-03-02 MED ORDER — DIVALPROEX SODIUM ER 500 MG PO TB24: 500.0000 mg | ORAL_TABLET | Freq: Every day | ORAL | 0 refills | Status: DC

## 2018-03-02 NOTE — Addendum Note (Signed)
Addended by: Noberto Retort C on: 03/02/2018 10:25 AM   Modules accepted: Orders

## 2018-03-02 NOTE — Telephone Encounter (Signed)
Pt has called for refill on divalproex (DEPAKOTE ER) 500 MG 24 hr tablet and lamoTRIgine (LAMICTAL) 150 MG tablet [place] Name: Medicare Member [id number] Group# n/a RX BIN# n/a

## 2018-03-02 NOTE — Telephone Encounter (Signed)
Patient has a pending appt on 05/14/2018.  Refills sent to requested pharmacy to last until her appt date.

## 2018-05-11 ENCOUNTER — Other Ambulatory Visit: Payer: Self-pay | Admitting: Neurology

## 2018-05-13 ENCOUNTER — Telehealth (INDEPENDENT_AMBULATORY_CARE_PROVIDER_SITE_OTHER): Payer: Medicare Other | Admitting: Neurology

## 2018-05-13 ENCOUNTER — Telehealth: Payer: Self-pay | Admitting: Neurology

## 2018-05-13 DIAGNOSIS — G40209 Localization-related (focal) (partial) symptomatic epilepsy and epileptic syndromes with complex partial seizures, not intractable, without status epilepticus: Secondary | ICD-10-CM | POA: Diagnosis not present

## 2018-05-13 DIAGNOSIS — D329 Benign neoplasm of meninges, unspecified: Secondary | ICD-10-CM

## 2018-05-13 MED ORDER — DIVALPROEX SODIUM ER 500 MG PO TB24: 500.0000 mg | ORAL_TABLET | Freq: Every day | ORAL | 4 refills | Status: DC

## 2018-05-13 MED ORDER — LAMOTRIGINE 150 MG PO TABS: ORAL_TABLET | ORAL | 4 refills | Status: DC

## 2018-05-13 NOTE — Telephone Encounter (Signed)
Chief Complains/Reason for telephone visit: Scheduled follow up visit for seizure  Names of all people present and their role: Patient and her husband  Relevant History: Vickie Gross is a 68 years old right-handed female, has been followed in our clinic for partial seizure, history of multiple meningioma, status post resection.  She had bilateral frontal meningioma resection in 1998, she presented with gradual onset memory loss, confusion, partial seizure, got lost while driving, post surgically, she had recurrent partial seizure, depression, was treated with different medications,  She also reported laser surgery for residual meningioma, she also had resection surgery later because of recurrent tumor, this was done at [facility].  She also had a yearly MRI follow-up by [facility]'s oncologist [name], was seen by neurologist [name] in our office in the past for partial seizure control, most recent neurological care was under [name],  who described different seizure types, event 1, hyperventilation, then lost consciousness, become stiff, lasting for 5 minutes, 2-3 times per year, event 2, zone out, eyes dilated, rolled back in her head, semi-rigid, foam coming out of mouth. Event 3, loss of consciousness without any warnings, eyes rolled back,   MRI of the brain with without contrast in January 2017, history of olfactory groove meningioma with extensive bilateral frontal gliosis, tissues in the left nasal cavity and on the right petrous ridge is predominantly ossified and stable since 2008,  Currently she is taking lamotrigine 150 mg twice a day, Keppra 750 mg 2 tablets twice a day since 2014,  She continues to have depression, she used to work as a Nurse, mental health, now on disability, husband was recently diagnosed with leukemia.  Lamictal has helped her mood, she denies significant side effect from [facility] record by  neurosurgeon Dr. Laban Emperor, recurrent olfactory groove meningioma, she had resection in [place] in 1998, had a small posterior cavernous sinus meningioma that underwent gamma knife radiation in December of 1998, she had recurrent tumor again in 2008, which involving as point sinus, some reoccurrence intra-cranial, she underwent resection of her recurrent meningioma on December 29 2006. There was a small amount of residual tumor attached to the medial aspect of optic nerve, there is also small of tumor in the ethmoid sinus, she was later referred for radiation therapy, by [name], 50.4 Gy completed April 17, 2007, SRS to left anterior middle cranial fossa lesion 14 Gy 01/26/2013, last follow-up was on July 8 2 706, she supposed to have repeat MRI, continue follow-up on a yearly basis.  She denies recurrent seizure since last visit, but reported episode of sudden onset vertigo, lasting for a few minutes without loss of consciousness,  She has a history of mixing up her anti-elliptic medications,  Most recent MRI of the brain with and without contrast in June 2018, The sequelae of previous olfactory ridge meningioma post operative changes, large bilateral frontal encephalomalacia, right clinoid and petrous ridge enhancing meningioma, 2 dural based enhancing lesion at the frontal lobe, left mass was present in 2017, right mass was not clearly present with size of 10 times 5 mm,  Last office visit was on January 07, 2017, she is scheduled to have office visit on May 14, 2018.  She is overall doing well, she denies recurrent seizure, compliant with her current medications lamotrigine 150 mg half tablets in the morning, 1 at night, Depakote ER 500 mg at nighttime  Results Reviewed: Most recent laboratory evaluation in July 2018, normal CBC,  CMP, lamotrigine level was 23.8, but it was not a trough level, Depakote level was 80.  Assessment and Plan:  History of recurrent meningioma Complex  partial seizure  I refilled her Depakote ER 500 mg every day, lamotrigine 150 mg half tablets in the morning, 1 tablet at night, 90-day supply was refilled,  Also putting the order for CMP, CBC, repeat lamotrigine level,  Will asked my office to call her to have a follow-up visit in 6 months.  Marcial Pacas, M.D. Ph.D.  [facility] [place], [place] [address] Phone: [phone] Fax:      [phone]

## 2018-05-13 NOTE — Telephone Encounter (Addendum)
Chief Complains/Reason for telephone visit: Scheduled follow up visit for seizure, patient is consent to complete evaluation via phone  Names of all people present and their role: Patient and her husband  Relevant History: Vickie Gross is a 68 years old right-handed female, has been followed in our clinic for partial seizure, history of multiple meningioma, status post resection.  She had bilateral frontal meningioma resection in 1998, she presented with gradual onset memory loss, confusion, partial seizure, got lost while driving, post surgically, she had recurrent partial seizure, depression, was treated with different medications,  She also reported laser surgery for residual meningioma, she also had resection surgery later because of recurrent tumor, this was done at [facility].  She also had a yearly MRI follow-up by [facility]'s oncologist [name], was seen by neurologist [name] in our office in the past for partial seizure control, most recent neurological care was under [name],  who described different seizure types, event 1, hyperventilation, then lost consciousness, become stiff, lasting for 5 minutes, 2-3 times per year, event 2, zone out, eyes dilated, rolled back in her head, semi-rigid, foam coming out of mouth. Event 3, loss of consciousness without any warnings, eyes rolled back,   MRI of the brain with without contrast in January 2017, history of olfactory groove meningioma with extensive bilateral frontal gliosis, tissues in the left nasal cavity and on the right petrous ridge is predominantly ossified and stable since 2008,  Currently she is taking lamotrigine 150 mg twice a day, Keppra 750 mg 2 tablets twice a day since 2014,  She continues to have depression, she used to work as a Nurse, mental health, now on disability, husband was recently diagnosed with leukemia.  Lamictal has helped her mood, she denies significant  side effect from [facility] record by neurosurgeon Dr. Laban Emperor, recurrent olfactory groove meningioma, she had resection in [place] in 1998, had a small posterior cavernous sinus meningioma that underwent gamma knife radiation in December of 1998, she had recurrent tumor again in 2008, which involving as point sinus, some reoccurrence intra-cranial, she underwent resection of her recurrent meningioma on December 29 2006. There was a small amount of residual tumor attached to the medial aspect of optic nerve, there is also small of tumor in the ethmoid sinus, she was later referred for radiation therapy, by [name], 50.4 Gy completed April 17, 2007, SRS to left anterior middle cranial fossa lesion 14 Gy 01/26/2013, last follow-up was on July 8 2 706, she supposed to have repeat MRI, continue follow-up on a yearly basis.  She denies recurrent seizure since last visit, but reported episode of sudden onset vertigo, lasting for a few minutes without loss of consciousness,  She has a history of mixing up her anti-elliptic medications,  Most recent MRI of the brain with and without contrast in June 2018, The sequelae of previous olfactory ridge meningioma post operative changes, large bilateral frontal encephalomalacia, right clinoid and petrous ridge enhancing meningioma, 2 dural based enhancing lesion at the frontal lobe, left mass was present in 2017, right mass was not clearly present with size of 10 times 5 mm,  Last office visit was on January 07, 2017, she is scheduled to have office visit on May 14, 2018.  She is overall doing well, she denies recurrent seizure, compliant with her current medications lamotrigine 150 mg half tablets in the morning, 1 at night, Depakote ER 500 mg at nighttime  Results Reviewed: Most  recent laboratory evaluation in July 2018, normal CBC, CMP, lamotrigine level was 23.8, but it was not a trough level, Depakote level was  80.  Assessment and Plan:  History of recurrent meningioma Complex partial seizure             I refilled her Depakote ER 500 mg every day, lamotrigine 150 mg half tablets in the morning, 1 tablet at night, 90-day supply was refilled,             Also putting the order for CMP, CBC, repeat lamotrigine level,             Will asked my office to call her to have a follow-up visit in 6 months.  Marcial Pacas, M.D. Ph.D.  [facility] [place], [place] [address] Phone: [phone] Fax:      [phone]  Documentation of Time of Medical Discussion:   [id number] minutes of medical discussion

## 2018-05-14 ENCOUNTER — Ambulatory Visit: Payer: Medicare Other | Admitting: Neurology

## 2018-11-12 DIAGNOSIS — Z1231 Encounter for screening mammogram for malignant neoplasm of breast: Secondary | ICD-10-CM | POA: Diagnosis not present

## 2018-12-23 ENCOUNTER — Telehealth: Payer: Self-pay | Admitting: Neurology

## 2018-12-23 NOTE — Telephone Encounter (Signed)
I called patient and LVM to reschedule 11/23 appointment due to Dr. Krista Blue vacation time. Requested patient call back to reschedule.

## 2019-01-18 ENCOUNTER — Ambulatory Visit: Payer: Self-pay | Admitting: Neurology

## 2019-02-22 ENCOUNTER — Other Ambulatory Visit: Payer: Self-pay

## 2019-02-22 ENCOUNTER — Ambulatory Visit (INDEPENDENT_AMBULATORY_CARE_PROVIDER_SITE_OTHER): Payer: Medicare Other | Admitting: Neurology

## 2019-02-22 ENCOUNTER — Encounter: Payer: Self-pay | Admitting: Neurology

## 2019-02-22 VITALS — BP 114/67 | HR 59 | Temp 96.5°F | Ht 65.0 in | Wt 133.0 lb

## 2019-02-22 DIAGNOSIS — G40209 Localization-related (focal) (partial) symptomatic epilepsy and epileptic syndromes with complex partial seizures, not intractable, without status epilepticus: Secondary | ICD-10-CM

## 2019-02-22 DIAGNOSIS — D329 Benign neoplasm of meninges, unspecified: Secondary | ICD-10-CM | POA: Diagnosis not present

## 2019-02-22 MED ORDER — DIVALPROEX SODIUM ER 500 MG PO TB24: 500.0000 mg | ORAL_TABLET | Freq: Every day | ORAL | 4 refills | Status: DC

## 2019-02-22 MED ORDER — LAMOTRIGINE 150 MG PO TABS: ORAL_TABLET | ORAL | 4 refills | Status: DC

## 2019-02-22 NOTE — Progress Notes (Signed)
PATIENT: Vickie Gross DOB: 17-Mar-1950  Chief Complaint  Patient presents with  . Seizures/Meningioma    She is here with her husband today for a routine follow up.  Says she is doing well.  No seizure activity reported.     HISTORICAL  Vickie Gross is a 68 years old right-handed female, has been followed in our clinic for partial seizure, history of multiple meningioma, status post resection.  She had bilateral frontal meningioma resection in 1998, she presented with gradual onset memory loss, confusion, partial seizure, got lost while driving, post surgically, she had recurrent partial seizure, depression, was treated with different medications,  She also reported laser surgery for residual meningioma, she also had resection surgery later because of recurrent tumor, this was done at [facility].  She also had a yearly MRI follow-up by [facility]'s oncologist [name], was seen by neurologist [name] in our office in the past for partial seizure control, most recent neurological care was under [name],  who described different seizure types, event 1, hyperventilation, then lost consciousness, become stiff, lasting for 5 minutes, 2-3 times per year, event 2, zone out, eyes dilated, rolled back in her head, semi-rigid, foam coming out of mouth. Event 3, loss of consciousness without any warnings, eyes rolled back,   MRI of the brain with without contrast in January 2017, history of olfactory groove meningioma with extensive bilateral frontal gliosis, tissues in the left nasal cavity and on the right petrous ridge is predominantly ossified and stable since 2008,  Currently she is taking lamotrigine 150 mg twice a day, Keppra 750 mg 2 tablets twice a day since 2014,  She continues to have depression, she used to work as a Nurse, mental health, now on disability, husband was recently diagnosed with leukemia.  Lamictal has helped her mood, she  denies significant side effect from [facility] record by neurosurgeon Dr. Laban Emperor, recurrent olfactory groove meningioma, she had resection in [place] in 1998, had a small posterior cavernous sinus meningioma that underwent gamma knife radiation in December of 1998, she had recurrent tumor again in 2008, which involving as point sinus, some reoccurrence intra-cranial, she underwent resection of her recurrent meningioma on December 29 2006. There was a small amount of residual tumor attached to the medial aspect of optic nerve, there is also small of tumor in the ethmoid sinus, she was later referred for radiation therapy, by [name], 50.4 Gy completed April 17, 2007, SRS to left anterior middle cranial fossa lesion 14 Gy 01/26/2013, last follow-up was on July 8 2 706, she supposed to have repeat MRI, continue follow-up on a yearly basis.  She denies recurrent seizure since last visit, but reported episode of sudden onset vertigo, lasting for a few minutes without loss of consciousness,  She has a history of mixing up her anti-elliptic medications,  Most recent MRI of the brain with and without contrast in June 2018, The sequelae of previous olfactory ridge meningioma post operative changes, large bilateral frontal encephalomalacia, right clinoid and petrous ridge enhancing meningioma, 2 dural based enhancing lesion at the frontal lobe, left mass was present in 2017, right mass was not clearly present with size of 10 times 5 mm,  She is overall doing well, she denies recurrent seizure, compliant with her current medications lamotrigine 150 mg half tablets in the morning, 1 at night, Depakote ER 500 mg at nighttime  REVIEW OF SYSTEMS: Full 14 system review of  systems performed and notable only for as above All other review of systems were negative.  ALLERGIES: No Known Allergies  HOME MEDICATIONS: Current Outpatient Medications  Medication Sig Dispense Refill  .  divalproex (DEPAKOTE ER) 500 MG 24 hr tablet Take 1 tablet (500 mg total) by mouth at bedtime. 90 tablet 4  . lamoTRIgine (LAMICTAL) 150 MG tablet 1/2 tab in the morning, one at night. 140 tablet 4   No current facility-administered medications for this visit.    PAST MEDICAL HISTORY: Past Medical History:  Diagnosis Date  . Brain tumor ([place])   . Breast tumor   . Focal seizures ([place])   . Meningiomas, multiple ([place])   . Vertigo     PAST SURGICAL HISTORY: Past Surgical History:  Procedure Laterality Date  . BRAIN SURGERY     x 4 (gamma knife, tumor removals, radiation)  . BREAST SURGERY      FAMILY HISTORY: Family History  Problem Relation Age of Onset  . Lung cancer Mother   . Heart disease Father   . Heart attack Father   . Diabetes Father   . Diabetes Sister   . Heart attack Sister   . Lung cancer Sister     SOCIAL HISTORY: Social History   Socioeconomic History  . Marital status: Married    Spouse name: Not on file  . Number of children: 2  . Years of education: Bachelors  . Highest education level: Not on file  Occupational History  . Occupation: Optometrist - part-time  Tobacco Use  . Smoking status: Never Smoker  . Smokeless tobacco: Never Used  Substance and Sexual Activity  . Alcohol use: No  . Drug use: No  . Sexual activity: Not on file  Other Topics Concern  . Not on file  Social History Narrative   Lives at home with her husband.   Right-handed.   2 cups caffeine per day.   Social Determinants of Health   Financial Resource Strain:   . Difficulty of Paying Living Expenses: Not on file  Food Insecurity:   . Worried About Charity fundraiser in the Last Year: Not on file  . Ran Out of Food in the Last Year: Not on file  Transportation Needs:   . Lack of Transportation (Medical): Not on file  . Lack of Transportation (Non-Medical): Not on file  Physical Activity:   . Days of Exercise per Week: Not on file  . Minutes of Exercise per  Session: Not on file  Stress:   . Feeling of Stress : Not on file  Social Connections:   . Frequency of Communication with Friends and Family: Not on file  . Frequency of Social Gatherings with Friends and Family: Not on file  . Attends Religious Services: Not on file  . Active Member of Clubs or Organizations: Not on file  . Attends Archivist Meetings: Not on file  . Marital Status: Not on file  Intimate Partner Violence:   . Fear of Current or Ex-Partner: Not on file  . Emotionally Abused: Not on file  . Physically Abused: Not on file  . Sexually Abused: Not on file     PHYSICAL EXAM   Vitals:   02/22/19 0754  BP: 114/67  Pulse: (!) 59  Temp: (!) 96.5 F (35.8 C)  Weight: 133 lb (60.3 kg)  Height: 5\' 5"  (1.651 m)    Not recorded      Body mass index is 22.13 kg/m.  PHYSICAL  EXAMNIATION:  Gen: NAD, conversant, well nourised, well groomed                     Cardiovascular: Regular rate rhythm, no peripheral edema, warm, nontender. Eyes: Conjunctivae clear without exudates or hemorrhage Neck: Supple, no carotid bruits. Pulmonary: Clear to auscultation bilaterally   NEUROLOGICAL EXAM:  MENTAL STATUS: Speech:    Speech is normal; slow to respond.  Cognition:     Orientation to time, place and person     Normal recent and remote memory     Normal Attention span and concentration     Normal Language, naming, repeating,spontaneous speech     Fund of knowledge   CRANIAL NERVES: CN II: Visual fields are full to confrontation. Pupils are round equal and briskly reactive to light. CN III, IV, VI: extraocular movement are normal. No ptosis. CN V: Facial sensation is intact to light touch CN VII: Face is symmetric with normal eye closure  CN VIII: Hearing is normal to causal conversation. CN IX, X: Phonation is normal. CN XI: Head turning and shoulder shrug are intact  MOTOR: There is no pronator drift of out-stretched arms. Muscle bulk and tone are  normal. Muscle strength is normal.  REFLEXES: Reflexes are 2+ and symmetric at the biceps, triceps, knees, and ankles. Plantar responses are flexor.  SENSORY: Intact to light touch, pinprick and vibratory sensation are intact in fingers and toes.  COORDINATION: There is no trunk or limb dysmetria noted.  GAIT/STANCE: Mildly unsteady gait, could not perform tandem walking   DIAGNOSTIC DATA (LABS, IMAGING, TESTING) - I reviewed patient records, labs, notes, testing and imaging myself where available.   ASSESSMENT AND PLAN  Vickie Gross is a 68 y.o. female   History of recurrent meningioma Complex partial seizure  Continue Depakote ER 500 mg every night, lamotrigine 150 mg 1/2 tab in the morning, 1 tablet at night.  Laboratory evaluations CMP, CBC, Depakote and lamotrigine level,  Return to clinic in 6 months    Marcial Pacas, M.D. Ph.D.  [facility] [address] [place], [place] [address] Ph: [phone] Fax: [phone]  CC: Referring Provider

## 2019-02-23 LAB — COMPREHENSIVE METABOLIC PANEL
ALT: 10 IU/L (ref 0–32)
AST: 16 IU/L (ref 0–40)
Albumin/Globulin Ratio: 2 (ref 1.2–2.2)
Albumin: 4.7 g/dL (ref 3.8–4.8)
Alkaline Phosphatase: 46 IU/L (ref 39–117)
BUN/Creatinine Ratio: 15 (ref 12–28)
BUN: 15 mg/dL (ref 8–27)
Bilirubin Total: 0.3 mg/dL (ref 0.0–1.2)
CO2: 28 mmol/L (ref 20–29)
Calcium: 9.9 mg/dL (ref 8.7–10.3)
Chloride: 104 mmol/L (ref 96–106)
Creatinine, Ser: 1.02 mg/dL — ABNORMAL HIGH (ref 0.57–1.00)
GFR calc Af Amer: 66 mL/min/{1.73_m2} (ref >59)
GFR calc non Af Amer: 57 mL/min/{1.73_m2} — ABNORMAL LOW (ref >59)
Globulin, Total: 2.4 g/dL (ref 1.5–4.5)
Glucose: 91 mg/dL (ref 65–99)
Potassium: 5.4 mmol/L — ABNORMAL HIGH (ref 3.5–5.2)
Sodium: 147 mmol/L — ABNORMAL HIGH (ref 134–144)
Total Protein: 7.1 g/dL (ref 6.0–8.5)

## 2019-02-23 LAB — CBC
Hematocrit: 41.3 % (ref 34.0–46.6)
Hemoglobin: 13.2 g/dL (ref 11.1–15.9)
MCH: 29.1 pg (ref 26.6–33.0)
MCHC: 32 g/dL (ref 31.5–35.7)
MCV: 91 fL (ref 79–97)
Platelets: 203 10*3/uL (ref 150–450)
RBC: 4.54 x10E6/uL (ref 3.77–5.28)
RDW: 13 % (ref 11.7–15.4)
WBC: 5.5 10*3/uL (ref 3.4–10.8)

## 2019-02-23 LAB — LAMOTRIGINE LEVEL: Lamotrigine Lvl: 14.1 ug/mL (ref 2.0–20.0)

## 2019-02-23 LAB — VALPROIC ACID LEVEL: Valproic Acid Lvl: 89 ug/mL (ref 50–100)

## 2019-02-24 ENCOUNTER — Telehealth: Payer: Self-pay | Admitting: Neurology

## 2019-02-24 NOTE — Telephone Encounter (Signed)
I spoke to the patient.  She verbalized understanding of her result and was in agreement to increase her water intake.

## 2019-02-24 NOTE — Telephone Encounter (Signed)
Please call patient, laboratory evaluation showed mild elevated creatinine 1.02, rest of the laboratory evaluation was normal, she should increase her water intake.

## 2019-08-23 ENCOUNTER — Telehealth: Payer: Self-pay | Admitting: Neurology

## 2019-08-23 ENCOUNTER — Other Ambulatory Visit: Payer: Self-pay

## 2019-08-23 ENCOUNTER — Encounter: Payer: Self-pay | Admitting: Neurology

## 2019-08-23 ENCOUNTER — Ambulatory Visit (INDEPENDENT_AMBULATORY_CARE_PROVIDER_SITE_OTHER): Payer: Medicare Other | Admitting: Neurology

## 2019-08-23 VITALS — BP 108/68 | HR 60 | Ht 66.0 in | Wt 136.0 lb

## 2019-08-23 DIAGNOSIS — D329 Benign neoplasm of meninges, unspecified: Secondary | ICD-10-CM

## 2019-08-23 DIAGNOSIS — G40209 Localization-related (focal) (partial) symptomatic epilepsy and epileptic syndromes with complex partial seizures, not intractable, without status epilepticus: Secondary | ICD-10-CM

## 2019-08-23 MED ORDER — DIVALPROEX SODIUM ER 500 MG PO TB24: 500.0000 mg | ORAL_TABLET | Freq: Every day | ORAL | 4 refills | Status: DC

## 2019-08-23 MED ORDER — LAMOTRIGINE 150 MG PO TABS: ORAL_TABLET | ORAL | 4 refills | Status: DC

## 2019-08-23 NOTE — Telephone Encounter (Signed)
Lab tech could not get labs today, I think the patient got nervous, please ask her to come back this week to have redrawn, drink plenty of water days leading up. I added on ammonia level.She needs blood work before MRI.

## 2019-08-23 NOTE — Telephone Encounter (Signed)
I called pt and someone answered then hung up.  Will try later.

## 2019-08-23 NOTE — Patient Instructions (Addendum)
Continue current medications  Let's get MRI of the brain  Check blood work today  See you back in 6 months

## 2019-08-23 NOTE — Progress Notes (Signed)
PATIENT: Vickie Gross DOB: [birth year]  REASON FOR VISIT: follow up HISTORY FROM: patient  HISTORY OF PRESENT ILLNESS: Today 08/23/19  HISTORY [name] is a 69years old right-handed female,has been followed in our clinic for partial seizure, history of multiple meningioma, status post resection.  She had bilateral frontal meningioma resection in 1998, she presented with gradual onset memory loss, confusion, partial seizure, got lost while driving, post surgically, she had recurrent partial seizure, depression, was treated with different medications, She also reported laser surgery for residual meningioma, she also had resection surgery later because of recurrent tumor, this was done at [facility].  She also had a yearly MRI follow-up by [facility]'s oncologist [name], was seen by neurologist [name] in our office in the past for partial seizure control, most recent neurological care was under [name], who described different seizure types, event 1, hyperventilation, then lost consciousness, become stiff, lasting for 5 minutes, 2-3 times per year, event 2, zone out, eyes dilated, rolled back in her head, semi-rigid, foam coming out of mouth. Event 3, loss of consciousness without any warnings, eyes rolled back,  MRI of the brain with without contrast in January 2017, history of olfactory groove meningioma with extensive bilateral frontal gliosis, tissues in the left nasal cavity and on the right petrous ridge is predominantly ossified and stable since 2008,  Currently she is taking lamotrigine 150 mg twice a day, Keppra 750 mg 2 tablets twice a day since 2014,  She continues to have depression, she used to work as a Nurse, mental health, now on disability, husband was recently diagnosed with leukemia.  Lamictal has helped her mood, she denies significant side effect from [facility] record by neurosurgeon Dr. Laban Emperor,  recurrent olfactory groove meningioma, she had resection in [place] in 1998, had a small posterior cavernous sinus meningioma that underwent gamma knife radiation in December of 1998, she had recurrent tumor again in 2008, which involving as point sinus, some reoccurrence intra-cranial, she underwent resection of her recurrent meningioma on December 29 2006. There was a small amount of residual tumor attached to the medial aspect of optic nerve, there is also small of tumor in the ethmoid sinus, she was later referred for radiation therapy, by [name], 50.4 Gy completed April 17, 2007, SRS to left anterior middle cranial fossa lesion 14 Gy 01/26/2013, last follow-up was on July 8 2 706, she supposed to have repeat MRI, continue follow-up on a yearly basis.  She denies recurrent seizure since last visit, but reported episode of sudden onset vertigo, lasting for a few minutes without loss of consciousness,  She has a history of mixing up her anti-elliptic medications,  Most recentMRI of the brain with and without contrast in June 2018, The sequelae of previous olfactory ridge meningioma post operative changes, large bilateral frontal encephalomalacia, right clinoid and petrous ridge enhancing meningioma, 2 dural based enhancing lesion at the frontal lobe, left mass was present in 2017, right mass was not clearly present with size of 10 times 5 mm,  She is overall doing well, she denies recurrent seizure, compliant with her current medications lamotrigine 150 mg half tablets in the morning, 1 at night, Depakote ER 500 mg at nighttime   Update August 23, 2019 SS: Here today with her husband for follow-up, denies recurrent seizure, continues on Depakote ER 500 mg at bedtime, Lamictal 150 mg 1/2 tablet in the morning, 1 tablet at night. Labs in  December 2020, Lamictal level 14.1, Depakote 89. Has not followed with [facility] in several years. Wears glasses, thinks she needs readers for  close up vision, suggest she get repeat ophthalmology evaluation. No new neuro symptoms, does seem to have some confusion going on, husband agrees, she doesn't drive, sometime forgets her medications.  REVIEW OF SYSTEMS: Out of a complete 14 system review of symptoms, the patient complains only of the following symptoms, and all other reviewed systems are negative.  Seizure  ALLERGIES: No Known Allergies  HOME MEDICATIONS: Outpatient Medications Prior to Visit  Medication Sig Dispense Refill  . divalproex (DEPAKOTE ER) 500 MG 24 hr tablet Take 1 tablet (500 mg total) by mouth at bedtime. 90 tablet 4  . lamoTRIgine (LAMICTAL) 150 MG tablet 1/2 tab in the morning, one at night. 140 tablet 4   No facility-administered medications prior to visit.    PAST MEDICAL HISTORY: Past Medical History:  Diagnosis Date  . Brain tumor ([place])   . Breast tumor   . Focal seizures ([place])   . Meningiomas, multiple ([place])   . Vertigo     PAST SURGICAL HISTORY: Past Surgical History:  Procedure Laterality Date  . BRAIN SURGERY     x 4 (gamma knife, tumor removals, radiation)  . BREAST SURGERY      FAMILY HISTORY: Family History  Problem Relation Age of Onset  . Lung cancer Mother   . Heart disease Father   . Heart attack Father   . Diabetes Father   . Diabetes Sister   . Heart attack Sister   . Lung cancer Sister     SOCIAL HISTORY: Social History   Socioeconomic History  . Marital status: Married    Spouse name: Not on file  . Number of children: 2  . Years of education: Bachelors  . Highest education level: Not on file  Occupational History  . Occupation: Optometrist - part-time  Tobacco Use  . Smoking status: Never Smoker  . Smokeless tobacco: Never Used  Substance and Sexual Activity  . Alcohol use: No  . Drug use: No  . Sexual activity: Not on file  Other Topics Concern  . Not on file  Social History Narrative   Lives at home with her husband.   Right-handed.   2 cups  caffeine per day.   Social Determinants of Health   Financial Resource Strain:   . Difficulty of Paying Living Expenses:   Food Insecurity:   . Worried About Charity fundraiser in the Last Year:   . Arboriculturist in the Last Year:   Transportation Needs:   . Film/video editor (Medical):   Marland Kitchen Lack of Transportation (Non-Medical):   Physical Activity:   . Days of Exercise per Week:   . Minutes of Exercise per Session:   Stress:   . Feeling of Stress :   Social Connections:   . Frequency of Communication with Friends and Family:   . Frequency of Social Gatherings with Friends and Family:   . Attends Religious Services:   . Active Member of Clubs or Organizations:   . Attends Archivist Meetings:   Marland Kitchen Marital Status:   Intimate Partner Violence:   . Fear of Current or Ex-Partner:   . Emotionally Abused:   Marland Kitchen Physically Abused:   . Sexually Abused:    PHYSICAL EXAM  Vitals:   08/23/19 0751  BP: 108/68  Pulse: 60  Weight: 136 lb (61.7 kg)  Height:  5\' 6"  (1.676 m)   Body mass index is 21.95 kg/m.  Generalized: Well developed, in no acute distress   Neurological examination  Mentation: Alert oriented to time, place, history taking. Follows all commands speech and language fluent Cranial nerve II-XII: Pupils were equal round reactive to light. Extraocular movements were full, visual field were full on confrontational test. Facial sensation and strength were normal. Head turning and shoulder shrug were normal and symmetric. Motor: The motor testing reveals 5 over 5 strength of all 4 extremities. Good symmetric motor tone is noted throughout.  Sensory: Sensory testing is intact to soft touch on all 4 extremities. No evidence of extinction is noted.  Coordination: Cerebellar testing reveals good finger-nose-finger and heel-to-shin bilaterally.  Gait and station: Gait is normal. Tandem gait is unsteady. Reflexes: Deep tendon reflexes are symmetric and normal  bilaterally.   DIAGNOSTIC DATA (LABS, IMAGING, TESTING) - I reviewed patient records, labs, notes, testing and imaging myself where available.  Lab Results  Component Value Date   WBC 5.5 02/22/2019   HGB 13.2 02/22/2019   HCT 41.3 02/22/2019   MCV 91 02/22/2019   PLT 203 02/22/2019      Component Value Date/Time   NA 147 (H) 02/22/2019 0842   K 5.4 (H) 02/22/2019 0842   CL 104 02/22/2019 0842   CO2 28 02/22/2019 0842   GLUCOSE 91 02/22/2019 0842   GLUCOSE 79 02/28/2015 0955   BUN 15 02/22/2019 0842   CREATININE 1.02 (H) 02/22/2019 0842   CALCIUM 9.9 02/22/2019 0842   PROT 7.1 02/22/2019 0842   ALBUMIN 4.7 02/22/2019 0842   AST 16 02/22/2019 0842   ALT 10 02/22/2019 0842   ALKPHOS 46 02/22/2019 0842   BILITOT 0.3 02/22/2019 0842   GFRNONAA 57 (L) 02/22/2019 0842   GFRAA 66 02/22/2019 0842   No results found for: CHOL, HDL, LDLCALC, LDLDIRECT, TRIG, CHOLHDL No results found for: HGBA1C No results found for: VITAMINB12 Lab Results  Component Value Date   TSH 2.310 07/08/2016    ASSESSMENT AND PLAN 69 y.o. year old female  has a past medical history of Brain tumor ([place]), Breast tumor, Focal seizures ([place]), Meningiomas, multiple ([place]), and Vertigo. here with:  1.  History of recurrent meningioma 2.  Complex partial seizure -No recurrent seizure -Continue Depakote ER 500 mg at bedtime -Continue Lamictal 150 mg, 1/2 tablet in the morning, 1 tablet in the evening -Will check routine blood work today, along with Depakote, Lamictal level, seems to have some confusion/repetitive questioning during visit today -Will repeat MRI of the brain with and without contrast due to history of recurrent meningioma, last was done in June 2018 showed only subtle changes. -Follow-up in 6 months or sooner if needed  I spent 30 minutes of face-to-face and non-face-to-face time with patient.  This included previsit chart review, lab review, study review, order entry, electronic health record  documentation, patient education.  Butler Denmark, AGNP-C, DNP 08/23/2019, 8:20 AM [facility] [address], [place], [place] [address] [phone]

## 2019-08-24 ENCOUNTER — Telehealth: Payer: Self-pay | Admitting: Neurology

## 2019-08-24 NOTE — Telephone Encounter (Signed)
Attempted to call pt, LVM to remind pt to return this week for additional labs,  Lab hours given in message

## 2019-08-24 NOTE — Telephone Encounter (Signed)
medicare order sent to GI. No auth they will reach out to the patient to schedule.  

## 2019-08-26 NOTE — Telephone Encounter (Signed)
Spoke to pt she will come by for labs next Tuesday 08/31/19

## 2019-08-31 ENCOUNTER — Other Ambulatory Visit: Payer: Self-pay

## 2019-08-31 ENCOUNTER — Other Ambulatory Visit (INDEPENDENT_AMBULATORY_CARE_PROVIDER_SITE_OTHER): Payer: Self-pay

## 2019-08-31 DIAGNOSIS — Z0289 Encounter for other administrative examinations: Secondary | ICD-10-CM

## 2019-08-31 DIAGNOSIS — G40209 Localization-related (focal) (partial) symptomatic epilepsy and epileptic syndromes with complex partial seizures, not intractable, without status epilepticus: Secondary | ICD-10-CM

## 2019-09-01 LAB — AMMONIA: Ammonia: 41 ug/dL (ref 34–178)

## 2019-09-02 ENCOUNTER — Other Ambulatory Visit: Payer: Self-pay | Admitting: *Deleted

## 2019-09-02 ENCOUNTER — Telehealth: Payer: Self-pay | Admitting: *Deleted

## 2019-09-02 DIAGNOSIS — G40209 Localization-related (focal) (partial) symptomatic epilepsy and epileptic syndromes with complex partial seizures, not intractable, without status epilepticus: Secondary | ICD-10-CM

## 2019-09-02 DIAGNOSIS — D329 Benign neoplasm of meninges, unspecified: Secondary | ICD-10-CM

## 2019-09-02 NOTE — Telephone Encounter (Signed)
I called pt and relayed the ammonia level was normal, but the initial requests for CBC, CMP lamotrigine and VPA were not done (as released and not reordered).  Pt aware of lab dates and hours,  (this Friday [id number] is available if she wants to come in).  I placed orders again.

## 2019-09-02 NOTE — Addendum Note (Signed)
Addended by: Brandon Melnick on: 09/02/2019 04:52 PM   Modules accepted: Orders

## 2019-09-05 ENCOUNTER — Other Ambulatory Visit: Payer: Self-pay

## 2019-09-05 ENCOUNTER — Emergency Department (HOSPITAL_COMMUNITY)
Admission: EM | Admit: 2019-09-05 | Discharge: 2019-09-05 | Disposition: A | Discharge status: Home / Self Care | Payer: Medicare (Managed Care) | Attending: Emergency Medicine | Admitting: Emergency Medicine

## 2019-09-05 ENCOUNTER — Encounter (HOSPITAL_COMMUNITY): Payer: Self-pay | Admitting: *Deleted

## 2019-09-05 DIAGNOSIS — Z86011 Personal history of benign neoplasm of the brain: Secondary | ICD-10-CM | POA: Insufficient documentation

## 2019-09-05 DIAGNOSIS — Y939 Activity, unspecified: Secondary | ICD-10-CM | POA: Diagnosis not present

## 2019-09-05 DIAGNOSIS — Y9289 Other specified places as the place of occurrence of the external cause: Secondary | ICD-10-CM | POA: Diagnosis not present

## 2019-09-05 DIAGNOSIS — W57XXXA Bitten or stung by nonvenomous insect and other nonvenomous arthropods, initial encounter: Secondary | ICD-10-CM | POA: Insufficient documentation

## 2019-09-05 DIAGNOSIS — Y999 Unspecified external cause status: Secondary | ICD-10-CM | POA: Diagnosis not present

## 2019-09-05 DIAGNOSIS — S70361A Insect bite (nonvenomous), right thigh, initial encounter: Secondary | ICD-10-CM | POA: Insufficient documentation

## 2019-09-05 DIAGNOSIS — Z85841 Personal history of malignant neoplasm of brain: Secondary | ICD-10-CM | POA: Diagnosis not present

## 2019-09-05 MED ORDER — DIPHENHYDRAMINE HCL 25 MG PO TABS
25.0000 mg | ORAL_TABLET | Freq: Four times a day (QID) | ORAL | 0 refills | Status: DC | PRN
Start: 2019-09-05 — End: 2019-12-20

## 2019-09-05 MED ORDER — DOXYCYCLINE HYCLATE 100 MG PO TABS
100.0000 mg | ORAL_TABLET | Freq: Once | ORAL | Status: AC
  Administered 2019-09-05: 100 mg via ORAL
  Filled 2019-09-05: qty 1

## 2019-09-05 MED ORDER — DIPHENHYDRAMINE HCL 25 MG PO CAPS
25.0000 mg | ORAL_CAPSULE | Freq: Once | ORAL | Status: AC
  Administered 2019-09-05: 25 mg via ORAL
  Filled 2019-09-05: qty 1

## 2019-09-05 MED ORDER — DOXYCYCLINE HYCLATE 100 MG PO CAPS
100.0000 mg | ORAL_CAPSULE | Freq: Two times a day (BID) | ORAL | 0 refills | Status: AC
Start: 2019-09-05 — End: 2019-09-15

## 2019-09-05 NOTE — Discharge Instructions (Addendum)
At this time there does not appear to be the presence of an emergent medical condition, however there is always the potential for conditions to change. Please read and follow the below instructions.  Please return to the Emergency Department immediately for any new or worsening symptoms. Please be sure to follow up with your Primary Care Provider within one week regarding your visit today; please call their office to schedule an appointment even if you are feeling better for a follow-up visit. Please take your antibiotic Doxycycline as prescribed until complete to help with your symptoms.  Please drink enough water to avoid dehydration and get plenty of rest. You may use the medication Benadryl as prescribed to help with itching.  Benadryl may make you drowsy so do not drive or drink alcohol with the medication.  Cool compresses may also be helpful.  Get help right away if: You cannot remove a tick. A part of a tick breaks off and gets stuck in your skin. You are feeling worse. You have fever or chills You have nausea or vomiting You have any new/concerning or worsening of symptoms  Please read the additional information packets attached to your discharge summary.  Do not take your medicine if  develop an itchy rash, swelling in your mouth or lips, or difficulty breathing; call 911 and seek immediate emergency medical attention if this occurs.  You may review your lab tests and imaging results in their entirety on your MyChart account.  Please discuss all results of fully with your primary care provider and other specialist at your follow-up visit.  Note: Portions of this text may have been transcribed using voice recognition software. Every effort was made to ensure accuracy; however, inadvertent computerized transcription errors may still be present.

## 2019-09-05 NOTE — ED Triage Notes (Signed)
Pt reports being bitten by an insect a week ago.  Pt thinks it was a tick.  Pt reports that the site is itchy and a little swollen. No large area of inflammation noted.

## 2019-09-05 NOTE — ED Provider Notes (Signed)
Vickie Gross   CSN: [id number] Arrival date & time: 09/05/19  1030     History Chief Complaint  Patient presents with   Insect Bite    Tick    Vickie Gross is a 69 y.o. female history of brain tumor, seizures, vertigo, hyperlipidemia.  Patient presents today with her husband for concern of tick bite.  Patient reports that 1 week ago she noticed a tick to her right upper lateral thigh, she picked it off while in the shower, unsure if she got the entire insect off.  She had been feeling well for several days after that however over the last 2-3 days she reports swelling and itching to the area.  Patient is not sure how long the tick may have been attached for.  She reports itching as a constant moderate intensity sensation nonradiating no clear aggravating or alleviating factors.  She reports minimal swelling and erythema.  Denies fever/chills, headache, neck pain, abdominal pain, nausea/vomiting, diarrhea, arthralgias/myalgias, rash or any additional concerns.   HPI     Past Medical History:  Diagnosis Date   Brain tumor [facility])    Breast tumor    Focal seizures ([place])    Meningiomas, multiple ([place])    Vertigo     Patient Active Problem List   Diagnosis Date Noted   Meningioma ([place]) 01/07/2017   Partial symptomatic epilepsy with complex partial seizures, not intractable, without status epilepticus ([place]) 01/07/2017   Hx of meningioma of the brain 01/25/2016   HYPERLIPIDEMIA 01/04/2008   Depression 01/04/2008   Complex partial seizure ([place]) 01/04/2008    Past Surgical History:  Procedure Laterality Date   BRAIN SURGERY     x 4 (gamma knife, tumor removals, radiation)   BREAST SURGERY       OB History   No obstetric history on file.     Family History  Problem Relation Age of Onset   Lung cancer Mother    Heart disease Father    Heart attack Father    Diabetes Father    Diabetes Sister    Heart  attack Sister    Lung cancer Sister     Social History   Tobacco Use   Smoking status: Never Smoker   Smokeless tobacco: Never Used  Scientific laboratory technician Use: Never assessed  Substance Use Topics   Alcohol use: No   Drug use: No    Home Medications Prior to Admission medications   Medication Sig Start Date End Date Taking? Authorizing Provider  diphenhydrAMINE (BENADRYL) 25 MG tablet Take 1 tablet (25 mg total) by mouth every 6 (six) hours as needed. 09/05/19   Nuala Alpha A, PA-C  divalproex (DEPAKOTE ER) 500 MG 24 hr tablet Take 1 tablet (500 mg total) by mouth at bedtime. 08/23/19   Suzzanne Cloud, NP  doxycycline (VIBRAMYCIN) 100 MG capsule Take 1 capsule (100 mg total) by mouth 2 (two) times daily for 10 days. 09/05/19 09/15/19  Nuala Alpha A, PA-C  lamoTRIgine (LAMICTAL) 150 MG tablet 1/2 tab in the morning, one at night. 08/23/19   Suzzanne Cloud, NP    Allergies    Patient has no known allergies.  Review of Systems   Review of Systems  Constitutional: Negative.  Negative for chills and fever.  Gastrointestinal: Negative.  Negative for abdominal pain, diarrhea, nausea and vomiting.  Musculoskeletal: Negative.  Negative for arthralgias and myalgias.  Skin: Positive for wound.  Neurological: Negative.  Negative  for headaches.    Physical Exam Updated Vital Signs BP 129/79 (BP Location: Left Arm)    Pulse 63    Temp 97.7 F (36.5 C) (Oral)    Resp 18    Ht 5\' 6"  (1.676 m)    Wt 59 kg    LMP 03/11/1994    SpO2 100%    BMI 20.98 kg/m   Physical Exam Constitutional:      General: She is not in acute distress.    Appearance: Normal appearance. She is well-developed. She is not ill-appearing or diaphoretic.  HENT:     Head: Normocephalic and atraumatic.  Eyes:     General: Vision grossly intact. Gaze aligned appropriately.     Pupils: Pupils are equal, round, and reactive to light.  Neck:     Trachea: Trachea and phonation normal.  Pulmonary:      Effort: Pulmonary effort is normal. No respiratory distress.  Abdominal:     General: There is no distension.     Palpations: Abdomen is soft.     Tenderness: There is no abdominal tenderness. There is no guarding or rebound.  Musculoskeletal:        General: Normal range of motion.     Cervical back: Normal range of motion.  Skin:    General: Skin is warm and dry.          Comments: Small erythematous papule with minimal induration.  No visible foreign body.  No streaking or fluctuance.  No visible rash elsewhere throughout body.    Neurological:     Mental Status: She is alert.     GCS: GCS eye subscore is 4. GCS verbal subscore is 5. GCS motor subscore is 6.     Comments: Speech is clear and goal oriented, follows commands Major Cranial nerves without deficit, no facial droop Moves extremities without ataxia, coordination intact  Psychiatric:        Behavior: Behavior normal.     ED Results / Procedures / Treatments   Labs (all labs ordered are listed, but only abnormal results are displayed) Labs Reviewed - No data to display  EKG None  Radiology No results found.  Procedures Procedures (including critical care time)  Medications Ordered in ED Medications  doxycycline (VIBRA-TABS) tablet 100 mg (100 mg Oral Given 09/05/19 1401)  diphenhydrAMINE (BENADRYL) capsule 25 mg (25 mg Oral Given 09/05/19 1401)    ED Course  I have reviewed the triage vital signs and the nursing notes.  Pertinent labs & imaging results that were available during my care of the patient were reviewed by me and considered in my medical decision making (see chart for details).    MDM Rules/Calculators/A&P                         Physical examination appears consistent with an insect bite, based on patient's history likely a tick.  Does not appear to have any retained foreign body. Patient denies any difficulty breathing or swallowing.  Pt has a patent airway without stridor and is handling  secretions without difficulty; no angioedema. No blisters, no pustules, no warmth, no draining sinus tracts, no superficial abscesses, no bullous impetigo, no vesicles, no desquamation, no target lesions with dusky purpura or a central bulla. Not tender to touch. No concern for superimposed infection. No concern for SJS, TEN, TSS, syphilis or other life-threatening condition.  Will start patient on doxycycline for cellulitis versus early tickborne illness.  Will prescribe  antihistamines to help with itching and encourage cool compresses.  Encourage patient to call her PCP today to schedule follow-up appointment for next week.  At this time there does not appear to be any evidence of an acute emergency medical condition and the patient appears stable for discharge with appropriate outpatient follow up. Diagnosis was discussed with patient who verbalizes understanding of care plan and is agreeable to discharge. I have discussed return precautions with patient and husband who verbalizes understanding. Patient encouraged to follow-up with their PCP. All questions answered.  Patient seen and evaluated by Dr. Vanita Panda during this visit who agrees with discharge with doxycycline/antihistamines and PCP follow-up.  Gross: Portions of this report may have been transcribed using voice recognition software. Every effort was made to ensure accuracy; however, inadvertent computerized transcription errors may still be present. Final Clinical Impression(s) / ED Diagnoses Final diagnoses:  Insect bite of right thigh, initial encounter    Rx / DC Orders ED Discharge Orders         Ordered    doxycycline (VIBRAMYCIN) 100 MG capsule  2 times daily     Discontinue  Reprint     09/05/19 1415    diphenhydrAMINE (BENADRYL) 25 MG tablet  Every 6 hours PRN     Discontinue  Reprint     09/05/19 1415           Gari Crown 09/05/19 1431    Carmin Muskrat, MD 09/06/19 2323

## 2019-09-06 ENCOUNTER — Telehealth: Payer: Self-pay

## 2019-09-06 NOTE — Telephone Encounter (Signed)
-----   Message from Suzzanne Cloud, NP sent at 09/06/2019  7:45 AM EDT ----- Ammonia level is normal.

## 2019-09-06 NOTE — Telephone Encounter (Signed)
Pt verified by name and DOB,  normal results given per provider, pt voiced understanding all question answered. °

## 2019-12-20 ENCOUNTER — Encounter: Payer: Self-pay | Admitting: Neurology

## 2019-12-20 ENCOUNTER — Telehealth: Payer: Self-pay | Admitting: Neurology

## 2019-12-20 ENCOUNTER — Ambulatory Visit (INDEPENDENT_AMBULATORY_CARE_PROVIDER_SITE_OTHER): Payer: Medicare (Managed Care) | Admitting: Neurology

## 2019-12-20 DIAGNOSIS — D329 Benign neoplasm of meninges, unspecified: Secondary | ICD-10-CM | POA: Diagnosis not present

## 2019-12-20 DIAGNOSIS — G40209 Localization-related (focal) (partial) symptomatic epilepsy and epileptic syndromes with complex partial seizures, not intractable, without status epilepticus: Secondary | ICD-10-CM | POA: Diagnosis not present

## 2019-12-20 NOTE — Progress Notes (Signed)
PATIENT: Vickie Gross DOB: 11-Aug-1950  REASON FOR VISIT: follow up HISTORY FROM: patient  HISTORY OF PRESENT ILLNESS: Today 12/20/19  HISTORY  Vickie Gross is a 69years old right-handed Gross,has been followed in our clinic for partial seizure, history of multiple meningioma, status post resection.  She had bilateral frontal meningioma resection in 1998, she presented with gradual onset memory loss, confusion, partial seizure, got lost while driving, post surgically, she had recurrent partial seizure, depression, was treated with different medications, She also reported laser surgery for residual meningioma, she also had resection surgery later because of recurrent tumor, this was done at [facility].  She also had a yearly MRI follow-up by [facility]'s oncologist [name], was seen by neurologist [name] in our office in the past for partial seizure control, most recent neurological care was under [name], who described different seizure types, event 1, hyperventilation, then lost consciousness, become stiff, lasting for 5 minutes, 2-3 times per year, event 2, zone out, eyes dilated, rolled back in her head, semi-rigid, foam coming out of mouth. Event 3, loss of consciousness without any warnings, eyes rolled back,  MRI of the brain with without contrast in January 2017, history of olfactory groove meningioma with extensive bilateral frontal gliosis, tissues in the left nasal cavity and on the right petrous ridge is predominantly ossified and stable since 2008,  Currently she is taking lamotrigine 150 mg twice a day, Keppra 750 mg 2 tablets twice a day since 2014,  She continues to have depression, she used to work as a Nurse, mental health, now on disability, husband was recently diagnosed with leukemia.  Lamictal has helped her mood, she denies significant side effect from [facility] record by neurosurgeon Dr. Laban Emperor,  recurrent olfactory groove meningioma, she had resection in [place] in 1998, had a small posterior cavernous sinus meningioma that underwent gamma knife radiation in December of 1998, she had recurrent tumor again in 2008, which involving as point sinus, some reoccurrence intra-cranial, she underwent resection of her recurrent meningioma on December 29 2006. There was a small amount of residual tumor attached to the medial aspect of optic nerve, there is also small of tumor in the ethmoid sinus, she was later referred for radiation therapy, by [name], 50.4 Gy completed April 17, 2007, SRS to left anterior middle cranial fossa lesion 14 Gy 01/26/2013, last follow-up was on July 8 2 706, she supposed to have repeat MRI, continue follow-up on a yearly basis.  She denies recurrent seizure since last visit, but reported episode of sudden onset vertigo, lasting for a few minutes without loss of consciousness,  She has a history of mixing up her anti-elliptic medications,  Most recentMRI of the brain with and without contrast in June 2018, The sequelae of previous olfactory ridge meningioma post operative changes, large bilateral frontal encephalomalacia, right clinoid and petrous ridge enhancing meningioma, 2 dural based enhancing lesion at the frontal lobe, left mass was present in 2017, right mass was not clearly present with size of 10 times 5 mm,  She is overall doing well, she denies recurrent seizure, compliant with her current medications lamotrigine 150 mg half tablets in the morning, 1 at night, Depakote ER 500 mg at nighttime   Update August 23, 2019 SS: Here today with her husband for follow-up, denies recurrent seizure, continues on Depakote ER 500 mg at bedtime, Lamictal 150 mg 1/2 tablet in the morning, 1 tablet at night. Labs  in December 2020, Lamictal level 14.1, Depakote 89. Has not followed with [facility] in several years. Wears glasses, thinks she needs readers for  close up vision, suggest she get repeat ophthalmology evaluation. No new neuro symptoms, does seem to have some confusion going on, husband agrees, she doesn't drive, sometime forgets her medications.  Update December 20, 2019 SS: Here today for follow-up with her husband, remains on Depakote ER 500 mg at bedtime, Lamictal 150 mg 1/2 tablet am/1 tablet pm. 2 seizures of staring spells reported, 1 was 4 months ago after leaving the bank, in the car, had drooling, staring lasting less than a minute, 2nd was 3 months ago, sitting at home, staring off, again lasted only a few seconds,  Afterwards " I do not know".  She manages her medicines, unclear if any missed doses.  Has been more forgetful at home, is high energy, has trouble relaxing. Husband is not forthcoming with information, doesn't want to upset her.  No falls.  Never had blood work completed after last visit in June.Prior to these episodes of seizures, really isn't sure when last was.  REVIEW OF SYSTEMS: Out of a complete 14 system review of symptoms, the patient complains only of the following symptoms, and all other reviewed systems are negative.  Seizures  ALLERGIES: No Known Allergies  HOME MEDICATIONS: Outpatient Medications Prior to Visit  Medication Sig Dispense Refill  . divalproex (DEPAKOTE ER) 500 MG 24 hr tablet Take 1 tablet (500 mg total) by mouth at bedtime. 90 tablet 4  . lamoTRIgine (LAMICTAL) 150 MG tablet 1/2 tab in the morning, one at night. 140 tablet 4  . diphenhydrAMINE (BENADRYL) 25 MG tablet Take 1 tablet (25 mg total) by mouth every 6 (six) hours as needed. 20 tablet 0   No facility-administered medications prior to visit.    PAST MEDICAL HISTORY: Past Medical History:  Diagnosis Date  . Brain tumor ([place])   . Breast tumor   . Focal seizures ([place])   . Meningiomas, multiple ([place])   . Vertigo     PAST SURGICAL HISTORY: Past Surgical History:  Procedure Laterality Date  . BRAIN SURGERY     x 4 (gamma  knife, tumor removals, radiation)  . BREAST SURGERY      FAMILY HISTORY: Family History  Problem Relation Age of Onset  . Lung cancer Mother   . Heart disease Father   . Heart attack Father   . Diabetes Father   . Diabetes Sister   . Heart attack Sister   . Lung cancer Sister     SOCIAL HISTORY: Social History   Socioeconomic History  . Marital status: Married    Spouse name: Not on file  . Number of children: 2  . Years of education: Bachelors  . Highest education level: Not on file  Occupational History  . Occupation: Optometrist - part-time  Tobacco Use  . Smoking status: Never Smoker  . Smokeless tobacco: Never Used  Vaping Use  . Vaping Use: Never assessed  Substance and Sexual Activity  . Alcohol use: No  . Drug use: No  . Sexual activity: Not on file  Other Topics Concern  . Not on file  Social History Narrative   Lives at home with her husband.   Right-handed.   2 cups caffeine per day.   Social Determinants of Health   Financial Resource Strain:   . Difficulty of Paying Living Expenses: Not on file  Food Insecurity:   . Worried About  Running Out of Food in the Last Year: Not on file  . Ran Out of Food in the Last Year: Not on file  Transportation Needs:   . Lack of Transportation (Medical): Not on file  . Lack of Transportation (Non-Medical): Not on file  Physical Activity:   . Days of Exercise per Week: Not on file  . Minutes of Exercise per Session: Not on file  Stress:   . Feeling of Stress : Not on file  Social Connections:   . Frequency of Communication with Friends and Family: Not on file  . Frequency of Social Gatherings with Friends and Family: Not on file  . Attends Religious Services: Not on file  . Active Member of Clubs or Organizations: Not on file  . Attends Archivist Meetings: Not on file  . Marital Status: Not on file  Intimate Partner Violence:   . Fear of Current or Ex-Partner: Not on file  . Emotionally Abused:  Not on file  . Physically Abused: Not on file  . Sexually Abused: Not on file   PHYSICAL EXAM  Vitals:   12/20/19 1110  BP: 116/74  Weight: 134 lb 6.4 oz (61 kg)  Height: 5\' 6"  (1.676 m)   Body mass index is 21.69 kg/m.  Generalized: Well developed, in no acute distress   Neurological examination  Mentation: Alert oriented to time, place, history taking. Follows all commands speech and language fluent Cranial nerve II-XII: Pupils were equal round reactive to light. Extraocular movements were full, visual field were full on confrontational test. Facial sensation and strength were normal.  Head turning and shoulder shrug  were normal and symmetric. Motor: The motor testing reveals 5 over 5 strength of all 4 extremities. Good symmetric motor tone is noted throughout.  Sensory: Sensory testing is intact to soft touch on all 4 extremities. No evidence of extinction is noted.  Coordination: Cerebellar testing reveals good finger-nose-finger and heel-to-shin bilaterally.  Gait and station: Gait is normal. Tandem gait is normal. Romberg is negative. No drift is seen.  Reflexes: Deep tendon reflexes are symmetric and normal bilaterally.   DIAGNOSTIC DATA (LABS, IMAGING, TESTING) - I reviewed patient records, labs, notes, testing and imaging myself where available.  Lab Results  Component Value Date   WBC 5.5 02/22/2019   HGB 13.2 02/22/2019   HCT 41.3 02/22/2019   MCV 91 02/22/2019   PLT 203 02/22/2019      Component Value Date/Time   NA 147 (H) 02/22/2019 0842   K 5.4 (H) 02/22/2019 0842   CL 104 02/22/2019 0842   CO2 28 02/22/2019 0842   GLUCOSE 91 02/22/2019 0842   GLUCOSE 79 02/28/2015 0955   BUN 15 02/22/2019 0842   CREATININE 1.02 (H) 02/22/2019 0842   CALCIUM 9.9 02/22/2019 0842   PROT 7.1 02/22/2019 0842   ALBUMIN 4.7 02/22/2019 0842   AST 16 02/22/2019 0842   ALT 10 02/22/2019 0842   ALKPHOS 46 02/22/2019 0842   BILITOT 0.3 02/22/2019 0842   GFRNONAA 57 (L)  02/22/2019 0842   GFRAA 66 02/22/2019 0842   No results found for: CHOL, HDL, LDLCALC, LDLDIRECT, TRIG, CHOLHDL No results found for: HGBA1C No results found for: VITAMINB12 Lab Results  Component Value Date   TSH 2.310 07/08/2016   ASSESSMENT AND PLAN 69 y.o. year old Gross  has a past medical history of Brain tumor ([place]), Breast tumor, Focal seizures ([place]), Meningiomas, multiple ([place]), and Vertigo. here with:  1.  History of recurrent  meningioma 2.  Complex partial seizure -2 partial seizure episodes, questionable medication mix up/missed -Check routine blood work today -MRI of the brain ordered at last visit, was not completed, will set this up -Continue Depakote ER 500 mg at bedtime -Continue Lamictal 150 mg, 1/2 tablet am/1 tablet pm  -Suggest her husband assist with medication management to ensure compliance -Call for seizures, follow-up in 6 months or sooner if needed  I spent 30 minutes of face-to-face and non-face-to-face time with patient.  This included previsit chart review, lab review, study review, order entry, electronic health record documentation, patient education.  Butler Denmark, AGNP-C, DNP 12/20/2019, 11:44 AM [facility] [address], [place], [place] [address] [phone]

## 2019-12-20 NOTE — Patient Instructions (Addendum)
Check blood work today  Will get MRI of the brain set up  Continue Lamictal and Depakote  Call for seizures  See you back in 6 months

## 2019-12-20 NOTE — Telephone Encounter (Signed)
Patient didn't have MRI of the brain done that was ordered at last visit, can you set this up please?

## 2019-12-21 ENCOUNTER — Encounter: Payer: Self-pay | Admitting: Neurology

## 2019-12-21 NOTE — Telephone Encounter (Signed)
Noted, GI tried to reach out to the patient.   She has a new insurance now [employer] that requires and authorization.. I started that and it is pending.

## 2019-12-22 LAB — CBC WITH DIFFERENTIAL/PLATELET
Basophils Absolute: 0 10*3/uL (ref 0.0–0.2)
Basos: 1 %
EOS (ABSOLUTE): 0.1 10*3/uL (ref 0.0–0.4)
Eos: 1 %
Hematocrit: 37.8 % (ref 34.0–46.6)
Hemoglobin: 12.5 g/dL (ref 11.1–15.9)
Immature Grans (Abs): 0 10*3/uL (ref 0.0–0.1)
Immature Granulocytes: 0 %
Lymphocytes Absolute: 2.1 10*3/uL (ref 0.7–3.1)
Lymphs: 38 %
MCH: 28.5 pg (ref 26.6–33.0)
MCHC: 33.1 g/dL (ref 31.5–35.7)
MCV: 86 fL (ref 79–97)
Monocytes Absolute: 0.6 10*3/uL (ref 0.1–0.9)
Monocytes: 10 %
Neutrophils Absolute: 2.8 10*3/uL (ref 1.4–7.0)
Neutrophils: 50 %
Platelets: 210 10*3/uL (ref 150–450)
RBC: 4.38 x10E6/uL (ref 3.77–5.28)
RDW: 13.3 % (ref 11.7–15.4)
WBC: 5.7 10*3/uL (ref 3.4–10.8)

## 2019-12-22 LAB — COMPREHENSIVE METABOLIC PANEL
ALT: 6 IU/L (ref 0–32)
AST: 16 IU/L (ref 0–40)
Albumin/Globulin Ratio: 1.8 (ref 1.2–2.2)
Albumin: 4.6 g/dL (ref 3.8–4.8)
Alkaline Phosphatase: 51 IU/L (ref 44–121)
BUN/Creatinine Ratio: 18 (ref 12–28)
BUN: 15 mg/dL (ref 8–27)
Bilirubin Total: 0.3 mg/dL (ref 0.0–1.2)
CO2: 24 mmol/L (ref 20–29)
Calcium: 9.2 mg/dL (ref 8.7–10.3)
Chloride: 103 mmol/L (ref 96–106)
Creatinine, Ser: 0.84 mg/dL (ref 0.57–1.00)
GFR calc Af Amer: 83 mL/min/{1.73_m2} (ref >59)
GFR calc non Af Amer: 72 mL/min/{1.73_m2} (ref >59)
Globulin, Total: 2.5 g/dL (ref 1.5–4.5)
Glucose: 82 mg/dL (ref 65–99)
Potassium: 4.4 mmol/L (ref 3.5–5.2)
Sodium: 142 mmol/L (ref 134–144)
Total Protein: 7.1 g/dL (ref 6.0–8.5)

## 2019-12-22 LAB — VALPROIC ACID LEVEL: Valproic Acid Lvl: 45 ug/mL — ABNORMAL LOW (ref 50–100)

## 2019-12-22 LAB — LAMOTRIGINE LEVEL: Lamotrigine Lvl: 13 ug/mL (ref 2.0–20.0)

## 2019-12-27 NOTE — Telephone Encounter (Signed)
[employer] medicare auth: [id number] (exp. 12/21/19 to 03/20/20) order faxed ot [facility] they will reach out to the patient to schedule

## 2019-12-27 NOTE — Telephone Encounter (Signed)
scheduled 11/11 2pm

## 2019-12-28 NOTE — Progress Notes (Signed)
I have reviewed and agreed above plan. 

## 2020-01-07 ENCOUNTER — Telehealth: Payer: Self-pay | Admitting: Diagnostic Neuroimaging

## 2020-01-07 DIAGNOSIS — G40209 Localization-related (focal) (partial) symptomatic epilepsy and epileptic syndromes with complex partial seizures, not intractable, without status epilepticus: Secondary | ICD-10-CM

## 2020-01-07 DIAGNOSIS — D329 Benign neoplasm of meninges, unspecified: Secondary | ICD-10-CM

## 2020-01-07 NOTE — Telephone Encounter (Signed)
Called by [facility] re: MRI report today. Sinonasal mass / meningioma has increased in size. Recommend follow up with [facility] who has been monitoring recurrent meningiomas.  Penni Bombard, MD 43/73/5789, 7:84 PM Certified in Neurology, Neurophysiology and Neuroimaging  [facility] [address] [place], [place] [address] [phone]    "1. There is a large sinonasal mass which has significantly increased in size compared to MRI study of July 28, 2016. This enhancing multilobulated mass measures up to 3.8 cm transverse by 4.6 cm AP by 4.5 cm craniocaudad dimension previously 3.8 x 3.8 x 1.8 cm. Superior mass margin enhancement is seen within the planum sphenoidale which may reflect meningioma. Mass extends inferiorly into the right nasal cavity causing significant mass effect on the middle and superior nasal turbinates which are nearly encased. There is occlusion along the right ostiomeatal unit. Lesions extends laterally into the right orbit with significant mass effect and bowing of the right orbital medial rectus muscle. There is also slight lateral deviation of the globe and optic nerve. There is at least abutment with indeterminant extension through the left lamina papyracea. Dedicated orbital imaging can be obtained for further characterization.   2. Postsurgical changes with large area of bifrontal encephalomalacia with ex vacuo dilation of the frontal horns. Bifrontal convexity meningioma seen on the left coronal image 72 measures 11 x 11 mm coronal dimension previously 11 x 9 mm. On the right image 73 meningioma measures 8 x 10 mm previously 8 x 4 mm. There is a 7 mm meningioma along the anterior falx.   3. There is residual dural based mass reflecting meningioma along the right petroclival region coronal image 49 series 28 measuring 11 mm transverse by 7 mm AP by 6 mm craniocaudad unchanged from July 28, 2016. "

## 2020-01-10 ENCOUNTER — Telehealth: Payer: Self-pay | Admitting: Neurology

## 2020-01-10 NOTE — Telephone Encounter (Signed)
Called and spoke to Clerance Lav, He accepted appointment for patient, Thursday 01/13/20 @ 3:30 with arrival time of 3:00.

## 2020-01-10 NOTE — Telephone Encounter (Signed)
I have sent urgent referral to [facility] .

## 2020-01-10 NOTE — Addendum Note (Signed)
Addended by: Marcial Pacas on: 01/10/2020 08:00 AM   Modules accepted: Orders

## 2020-01-10 NOTE — Telephone Encounter (Signed)
Dr. Krista Blue I have sent Referral as Urgent to Neurosurgery . Patient's Husband will Need to come back and get CD to take with him to take with him to [facility]

## 2020-01-10 NOTE — Telephone Encounter (Signed)
IMPRESSION:   1. There is a large sinonasal mass which has significantly increased in size compared to MRI study of July 28, 2016. This enhancing multilobulated mass measures up to 3.8 cm transverse by 4.6 cm AP by 4.5 cm craniocaudad dimension previously 3.8 x 3.8 x 1.8 cm. Superior mass margin enhancement is seen within the planum sphenoidale which may reflect meningioma. Mass extends inferiorly into the right nasal cavity causing significant mass effect on the middle and superior nasal turbinates which are nearly encased. There is occlusion along the right ostiomeatal unit. Lesions extends laterally into the right orbit with significant mass effect and bowing of the right orbital medial rectus muscle. There is also slight lateral deviation of the globe and optic nerve. There is at least abutment with indeterminant extension through the left lamina papyracea. Dedicated orbital imaging can be obtained for further characterization.   2. Postsurgical changes with large area of bifrontal encephalomalacia with ex vacuo dilation of the frontal horns. Bifrontal convexity meningioma seen on the left coronal image 72 measures 11 x 11 mm coronal dimension previously 11 x 9 mm. On the right image 73 meningioma measures 8 x 10 mm previously 8 x 4 mm. There is a 7 mm meningioma along the anterior falx.   3. There is residual dural based mass reflecting meningioma along the right petroclival region coronal image 49 series 28 measuring 11 mm transverse by 7 mm AP by 6 mm craniocaudad unchanged from July 28, 2016.   I have called patient and her husband, reported increased size of meningioma,  Will refer her to [facility] neurosurgeon, she will MyChart me previous surgeon's name Please give her a follow-up appointment with me at next available Also have MRI from [place] CD ready for me to review

## 2020-01-11 ENCOUNTER — Telehealth: Payer: Self-pay | Admitting: *Deleted

## 2020-01-11 NOTE — Telephone Encounter (Signed)
Received at my desk,  [facility] MRI report and CD Placed in Dr. Krista Blue inbox for appt 01-13-20.

## 2020-01-13 ENCOUNTER — Telehealth: Payer: Self-pay | Admitting: Neurology

## 2020-01-13 ENCOUNTER — Encounter: Payer: Self-pay | Admitting: Neurology

## 2020-01-13 ENCOUNTER — Ambulatory Visit: Payer: Medicare (Managed Care) | Admitting: Neurology

## 2020-01-13 VITALS — BP 107/66 | HR 67 | Ht 66.0 in | Wt 135.5 lb

## 2020-01-13 DIAGNOSIS — D329 Benign neoplasm of meninges, unspecified: Secondary | ICD-10-CM

## 2020-01-13 DIAGNOSIS — G40209 Localization-related (focal) (partial) symptomatic epilepsy and epileptic syndromes with complex partial seizures, not intractable, without status epilepticus: Secondary | ICD-10-CM | POA: Diagnosis not present

## 2020-01-13 NOTE — Progress Notes (Signed)
PATIENT: Vickie Gross DOB: March 16, 1950  HISTORY OF PRESENT ILLNESS:  Vickie Gross is a 69years old right-handed female,has been followed in our clinic for partial seizure, history of multiple meningioma, status post resection.  She had bilateral frontal meningioma resection in 1998, she presented with gradual onset memory loss, confusion, partial seizure, got lost while driving, post surgically, she had recurrent partial seizure, depression, was treated with different medications, She also reported laser surgery for residual meningioma, she also had resection surgery later because of recurrent tumor, this was done at [facility].  She also had a yearly MRI follow-up by [facility]'s oncologist [name], was seen by neurologist [name] in our office in the past for partial seizure control, most recent neurological care was under [name], who described different seizure types, event 1, hyperventilation, then lost consciousness, become stiff, lasting for 5 minutes, 2-3 times per year, event 2, zone out, eyes dilated, rolled back in her head, semi-rigid, foam coming out of mouth. Event 3, loss of consciousness without any warnings, eyes rolled back,  MRI of the brain with without contrast in January 2017, history of olfactory groove meningioma with extensive bilateral frontal gliosis, tissues in the left nasal cavity and on the right petrous ridge is predominantly ossified and stable since 2008,  Currently she is taking lamotrigine 150 mg twice a day, Keppra 750 mg 2 tablets twice a day since 2014,  She continues to have depression, she used to work as a Nurse, mental health, now on disability, husband was recently diagnosed with leukemia.  Lamictal has helped her mood, she denies significant side effect from [facility] record by neurosurgeon Dr. Laban Emperor, recurrent olfactory groove meningioma, she had resection in [place] in 1998,  had a small posterior cavernous sinus meningioma that underwent gamma knife radiation in December of 1998, she had recurrent tumor again in 2008, which involving as point sinus, some reoccurrence intra-cranial, she underwent resection of her recurrent meningioma on December 29 2006. There was a small amount of residual tumor attached to the medial aspect of optic nerve, there is also small of tumor in the ethmoid sinus, she was later referred for radiation therapy, by [name], 50.4 Gy completed April 17, 2007, SRS to left anterior middle cranial fossa lesion 14 Gy 01/26/2013, last follow-up was on July 8 2 706, she supposed to have repeat MRI, continue follow-up on a yearly basis.  She denies recurrent seizure since last visit, but reported episode of sudden onset vertigo, lasting for a few minutes without loss of consciousness,  She has a history of mixing up her anti-elliptic medications,  Most recentMRI of the brain with and without contrast in June 2018, The sequelae of previous olfactory ridge meningioma post operative changes, large bilateral frontal encephalomalacia, right clinoid and petrous ridge enhancing meningioma, 2 dural based enhancing lesion at the frontal lobe, left mass was present in 2017, right mass was not clearly present with size of 10 times 5 mm,  She is overall doing well, she denies recurrent seizure, compliant with her current medications lamotrigine 150 mg half tablets in the morning, 1 at night, Depakote ER 500 mg at nighttime   UPDATE Jan 14 2020: Cortny came in with her husband to review her most recent MRI brain with contrast,  We personally reviewed the film from [facility] on January 07, 2020: There is a large sinonasal mass which has significantly increased in size compared to MRI study of  July 28, 2016. This enhancing multilobulated mass measures up to 3.8 cm transverse by 4.6 cm AP by 4.5 cm craniocaudad dimension previously 3.8 x 3.8 x 1.8 cm.  Superior mass margin enhancement is seen within the planum sphenoidale which may reflect meningioma. Mass extends inferiorly into the right nasal cavity causing significant mass effect on the middle and superior nasal turbinates which are nearly encased. There is occlusion along the right ostiomeatal unit. Lesions extends laterally into the right orbit with significant mass effect and bowing of the right orbital medial rectus muscle. There is also slight lateral deviation of the globe and optic nerve. There is at least abutment with indeterminant extension through the left lamina papyracea. Dedicated orbital imaging can be obtained for further characterization.   2. Postsurgical changes with large area of bifrontal encephalomalacia with ex vacuo dilation of the frontal horns. Bifrontal convexity meningioma seen on the left coronal image 72 measures 11 x 11 mm coronal dimension previously 11 x 9 mm. On the right image 73 meningioma measures 8 x 10 mm previously 8 x 4 mm. There is a 7 mm meningioma along the anterior falx.   3. There is residual dural based mass reflecting meningioma along the right petroclival region coronal image 49 series 28 measuring 11 mm transverse by 7 mm AP by 6 mm craniocaudad unchanged from July 28, 2016.   Patient is tolerating current medications of lamotrigine 150 mg half tablet in the morning, 1 tablet every night and Depakote ER 500 mg every night, she denied recurrent seizure, she fell yesterday January 12, 2020, missing the corner of her dishwasher, she denied loss of consciousness, came in was bruised right face and right eye, she already had very poor vision in her left eye following previous meningioma surgery,  REVIEW OF SYSTEMS: Out of a complete 14 system review of symptoms, the patient complains only of the following symptoms, and all other reviewed systems are negative.  Seizures  ALLERGIES: No Known Allergies  HOME MEDICATIONS: Outpatient Medications Prior  to Visit  Medication Sig Dispense Refill  . divalproex (DEPAKOTE ER) 500 MG 24 hr tablet Take 1 tablet (500 mg total) by mouth at bedtime. 90 tablet 4  . lamoTRIgine (LAMICTAL) 150 MG tablet 1/2 tab in the morning, one at night. 140 tablet 4   No facility-administered medications prior to visit.    PAST MEDICAL HISTORY: Past Medical History:  Diagnosis Date  . Brain tumor ([place])   . Breast tumor   . Focal seizures ([place])   . Meningiomas, multiple ([place])   . Vertigo     PAST SURGICAL HISTORY: Past Surgical History:  Procedure Laterality Date  . BRAIN SURGERY     x 4 (gamma knife, tumor removals, radiation)  . BREAST SURGERY      FAMILY HISTORY: Family History  Problem Relation Age of Onset  . Lung cancer Mother   . Heart disease Father   . Heart attack Father   . Diabetes Father   . Diabetes Sister   . Heart attack Sister   . Lung cancer Sister     SOCIAL HISTORY: Social History   Socioeconomic History  . Marital status: Married    Spouse name: Not on file  . Number of children: 2  . Years of education: Bachelors  . Highest education level: Not on file  Occupational History  . Occupation: Optometrist - part-time  Tobacco Use  . Smoking status: Never Smoker  . Smokeless tobacco: Never Used  Vaping Use  .  Vaping Use: Never assessed  Substance and Sexual Activity  . Alcohol use: No  . Drug use: No  . Sexual activity: Not on file  Other Topics Concern  . Not on file  Social History Narrative   Lives at home with her husband.   Right-handed.   2 cups caffeine per day.   Social Determinants of Health   Financial Resource Strain:   . Difficulty of Paying Living Expenses: Not on file  Food Insecurity:   . Worried About Charity fundraiser in the Last Year: Not on file  . Ran Out of Food in the Last Year: Not on file  Transportation Needs:   . Lack of Transportation (Medical): Not on file  . Lack of Transportation (Non-Medical): Not on file  Physical  Activity:   . Days of Exercise per Week: Not on file  . Minutes of Exercise per Session: Not on file  Stress:   . Feeling of Stress : Not on file  Social Connections:   . Frequency of Communication with Friends and Family: Not on file  . Frequency of Social Gatherings with Friends and Family: Not on file  . Attends Religious Services: Not on file  . Active Member of Clubs or Organizations: Not on file  . Attends Archivist Meetings: Not on file  . Marital Status: Not on file  Intimate Partner Violence:   . Fear of Current or Ex-Partner: Not on file  . Emotionally Abused: Not on file  . Physically Abused: Not on file  . Sexually Abused: Not on file   PHYSICAL EXAM  Vitals:   01/13/20 1530  BP: 107/66  Pulse: 67  Weight: 135 lb 8 oz (61.5 kg)  Height: 5\' 6"  (1.676 m)   Body mass index is 21.87 kg/m.   PHYSICAL EXAMNIATION:  Gen: NAD, conversant, well nourised, well groomed                     Cardiovascular: Regular rate rhythm, no peripheral edema, warm, nontender. Eyes: Conjunctivae clear without exudates or hemorrhage Neck: Supple, no carotid bruits. Pulmonary: Clear to auscultation bilaterally   NEUROLOGICAL EXAM:  MENTAL STATUS: Speech/Cognition: Awake, alert, normal speech, oriented to history taking and casual conversation.  CRANIAL NERVES: CN II: Right eye can read regular print, left eye could not read large print CN III, IV, VI: extraocular movement are normal. No ptosis. CN V: Facial sensation is intact to light touch. CN MEQ:ASTMH face is bruised. CN VIII: Hearing is normal to casual conversation CN IX, X: Palate elevates symmetrically. Phonation is normal. CN XI: Head turning and shoulder shrug are intact CN XII: Tongue is midline with normal movements and no atrophy.  MOTOR: Muscle bulk and tone are normal. Muscle strength is normal.  REFLEXES: Reflexes are 2  and symmetric at the biceps, triceps, knees and ankles. Plantar responses are  flexor.  SENSORY: Intact to light touch, pinprick, positional and vibratory sensation at fingers and toes.  COORDINATION: There is no trunk or limb ataxia.    GAIT/STANCE: Posture is normal. Gait is steady with normal steps, base, arm swing and turning.     DIAGNOSTIC DATA (LABS, IMAGING, TESTING) - I reviewed patient records, labs, notes, testing and imaging myself where available.  Lab Results  Component Value Date   WBC 5.7 12/20/2019   HGB 12.5 12/20/2019   HCT 37.8 12/20/2019   MCV 86 12/20/2019   PLT 210 12/20/2019      Component Value  Date/Time   NA 142 12/20/2019 1149   K 4.4 12/20/2019 1149   CL 103 12/20/2019 1149   CO2 24 12/20/2019 1149   GLUCOSE 82 12/20/2019 1149   GLUCOSE 79 02/28/2015 0955   BUN 15 12/20/2019 1149   CREATININE 0.84 12/20/2019 1149   CALCIUM 9.2 12/20/2019 1149   PROT 7.1 12/20/2019 1149   ALBUMIN 4.6 12/20/2019 1149   AST 16 12/20/2019 1149   ALT 6 12/20/2019 1149   ALKPHOS 51 12/20/2019 1149   BILITOT 0.3 12/20/2019 1149   GFRNONAA 72 12/20/2019 1149   GFRAA 83 12/20/2019 1149   No results found for: CHOL, HDL, LDLCALC, LDLDIRECT, TRIG, CHOLHDL No results found for: HGBA1C No results found for: VITAMINB12 Lab Results  Component Value Date   TSH 2.310 07/08/2016   ASSESSMENT AND PLAN 69 y.o. year old female    Recurrent meningioma Complex partial seizure  Continue current medication management, lamotrigine 150 mg half tablet in the morning, 1 tablet at night, and Depakote ER 500 mg at night  Repeat MRI of the brain showed significant enlargement of meningioma, mass extending inferiorly into the right nasal cavity, laterally into right orbit with significant mass-effect and bowing of the right orbital medial rectus muscle, there is also slight lateral deviation of the right globe and optic nerve, postsurgical change of bilateral frontal encephalomalacia,  She has appointment with [facility] neurosurgeon Monday, November  22     Marcial Pacas, M.D. Ph.D.  [facility] [place], [place] [address] Phone: [phone] Fax:      [phone]

## 2020-01-13 NOTE — Telephone Encounter (Signed)
Sharyn Lull RN  Everything done patient has CD Apt next week . Patient's husband aware.

## 2020-01-13 NOTE — Telephone Encounter (Signed)
Urgent refer to [facility] for rapid enlarging meningioma  1. There is a large sinonasal mass which has significantly increased in size compared to MRI study of July 28, 2016. This enhancing multilobulated mass measures up to 3.8 cm transverse by 4.6 cm AP by 4.5 cm craniocaudad dimension previously 3.8 x 3.8 x 1.8 cm. Superior mass margin enhancement is seen within the planum sphenoidale which may reflect meningioma. Mass extends inferiorly into the right nasal cavity causing significant mass effect on the middle and superior nasal turbinates which are nearly encased. There is occlusion along the right ostiomeatal unit. Lesions extends laterally into the right orbit with significant mass effect and bowing of the right orbital medial rectus muscle. There is also slight lateral deviation of the globe and optic nerve. There is at least abutment with indeterminant extension through the left lamina papyracea. Dedicated orbital imaging can be obtained for further characterization.   2. Postsurgical changes with large area of bifrontal encephalomalacia with ex vacuo dilation of the frontal horns. Bifrontal convexity meningioma seen on the left coronal image 72 measures 11 x 11 mm coronal dimension previously 11 x 9 mm. On the right image 73 meningioma measures 8 x 10 mm previously 8 x 4 mm. There is a 7 mm meningioma along the anterior falx.   3. There is residual dural based mass reflecting meningioma along the right petroclival region coronal image 49 series 28 measuring 11 mm transverse by 7 mm AP by 6 mm craniocaudad unchanged from July 28, 2016.

## 2020-01-14 ENCOUNTER — Telehealth: Payer: Self-pay | Admitting: Neurology

## 2020-01-14 NOTE — Telephone Encounter (Signed)
Call patient to check on her appointment with [facility] neurosurgeon on November 22

## 2020-05-08 ENCOUNTER — Ambulatory Visit: Payer: Medicare (Managed Care) | Admitting: Neurology

## 2020-05-23 DIAGNOSIS — Z01818 Encounter for other preprocedural examination: Secondary | ICD-10-CM | POA: Insufficient documentation

## 2020-05-23 DIAGNOSIS — G40909 Epilepsy, unspecified, not intractable, without status epilepticus: Secondary | ICD-10-CM | POA: Insufficient documentation

## 2020-05-25 DIAGNOSIS — G9389 Other specified disorders of brain: Secondary | ICD-10-CM | POA: Insufficient documentation

## 2020-06-20 ENCOUNTER — Ambulatory Visit: Payer: Medicare (Managed Care) | Admitting: Neurology

## 2020-08-16 DIAGNOSIS — C719 Malignant neoplasm of brain, unspecified: Secondary | ICD-10-CM | POA: Insufficient documentation

## 2020-09-05 ENCOUNTER — Telehealth: Payer: Self-pay

## 2020-09-05 MED ORDER — LAMOTRIGINE 150 MG PO TABS: ORAL_TABLET | ORAL | 0 refills | Status: DC

## 2020-09-05 NOTE — Telephone Encounter (Signed)
Pt needs to make appt for further refills

## 2020-09-08 DIAGNOSIS — D508 Other iron deficiency anemias: Secondary | ICD-10-CM | POA: Insufficient documentation

## 2020-09-08 DIAGNOSIS — E559 Vitamin D deficiency, unspecified: Secondary | ICD-10-CM | POA: Insufficient documentation

## 2020-09-13 DIAGNOSIS — J3489 Other specified disorders of nose and nasal sinuses: Secondary | ICD-10-CM | POA: Insufficient documentation

## 2020-09-22 ENCOUNTER — Other Ambulatory Visit: Payer: Self-pay | Admitting: Neurology

## 2020-11-11 ENCOUNTER — Encounter: Payer: Self-pay | Admitting: Neurology

## 2020-12-20 DIAGNOSIS — H5213 Myopia, bilateral: Secondary | ICD-10-CM | POA: Insufficient documentation

## 2020-12-20 DIAGNOSIS — H53482 Generalized contraction of visual field, left eye: Secondary | ICD-10-CM | POA: Insufficient documentation

## 2021-01-15 DIAGNOSIS — S2242XD Multiple fractures of ribs, left side, subsequent encounter for fracture with routine healing: Secondary | ICD-10-CM | POA: Insufficient documentation

## 2021-01-15 DIAGNOSIS — W19XXXA Unspecified fall, initial encounter: Secondary | ICD-10-CM | POA: Insufficient documentation

## 2021-01-15 DIAGNOSIS — S2242XA Multiple fractures of ribs, left side, initial encounter for closed fracture: Secondary | ICD-10-CM | POA: Insufficient documentation

## 2021-02-06 ENCOUNTER — Other Ambulatory Visit: Payer: Self-pay | Admitting: Neurology

## 2021-03-05 ENCOUNTER — Other Ambulatory Visit: Payer: Self-pay | Admitting: Neurology

## 2021-03-19 ENCOUNTER — Encounter: Payer: Self-pay | Admitting: Neurology

## 2021-03-19 ENCOUNTER — Ambulatory Visit: Payer: Medicare HMO | Admitting: Neurology

## 2021-03-19 VITALS — BP 114/75 | HR 65 | Ht 66.0 in | Wt 131.0 lb

## 2021-03-19 DIAGNOSIS — D329 Benign neoplasm of meninges, unspecified: Secondary | ICD-10-CM | POA: Diagnosis not present

## 2021-03-19 DIAGNOSIS — G40209 Localization-related (focal) (partial) symptomatic epilepsy and epileptic syndromes with complex partial seizures, not intractable, without status epilepticus: Secondary | ICD-10-CM

## 2021-03-19 MED ORDER — LAMOTRIGINE 200 MG PO TABS: 200.0000 mg | ORAL_TABLET | Freq: Two times a day (BID) | ORAL | 4 refills | Status: DC

## 2021-03-19 MED ORDER — DIVALPROEX SODIUM ER 500 MG PO TB24: 500.0000 mg | ORAL_TABLET | Freq: Every day | ORAL | 4 refills | Status: DC

## 2021-03-19 NOTE — Progress Notes (Signed)
PATIENT: Vickie Gross DOB: 1950/04/30  HISTORY OF PRESENT ILLNESS:  Vickie Gross is a 71 years old right-handed female, has been followed in our clinic for partial seizure, history of multiple meningioma, status post resection.   She had bilateral frontal meningioma resection in 1998, she presented with gradual onset memory loss, confusion, partial seizure, got lost while driving, post surgically, she had recurrent partial seizure, depression, was treated with different medications,  She also reported laser surgery for residual meningioma, she also had resection surgery later because of recurrent tumor, this was done at [facility].   She also had a yearly MRI follow-up by [facility]'s oncologist [name], was seen by neurologist [name] in our office in the past for partial seizure control, most recent neurological care was under [name],  who described different seizure types, event 1, hyperventilation, then lost consciousness, become stiff, lasting for 5 minutes, 2-3 times per year, event 2, zone out, eyes dilated, rolled back in her head, semi-rigid, foam coming out of mouth. Event 3, loss of consciousness without any warnings, eyes rolled back,   MRI of the brain with without contrast in January 2017, history of olfactory groove meningioma with extensive bilateral frontal gliosis, tissues in the left nasal cavity and on the right petrous ridge is predominantly ossified and stable since 2008,   Currently she is taking lamotrigine 150 mg twice a day, Keppra 750 mg 2 tablets twice a day since 2014,   She continues to have depression, she used to work as a Nurse, mental health, now on disability, husband was recently diagnosed with leukemia.   Lamictal has helped her mood, she denies significant side effect from [facility] record by neurosurgeon Dr. Laban Emperor, recurrent olfactory groove meningioma, she had resection in [place] in 1998,  had a small posterior cavernous sinus meningioma that underwent gamma knife radiation in December of 1998, she had recurrent tumor again in 2008, which involving as point sinus, some reoccurrence intra-cranial, she underwent resection of her recurrent meningioma on December 29 2006. There was a small amount of residual tumor attached to the medial aspect of optic nerve, there is also small of tumor in the ethmoid sinus, she was later referred for radiation therapy, by [name], 50.4 Gy completed April 17, 2007, SRS to left anterior middle cranial fossa lesion 14 Gy 01/26/2013, last follow-up was on July 8 2 706, she supposed to have repeat MRI, continue follow-up on a yearly basis.   She denies recurrent seizure since last visit, but reported episode of sudden onset vertigo, lasting for a few minutes without loss of consciousness,   She has a history of mixing up her anti-elliptic medications,   Most recent MRI of the brain with and without contrast in June 2018, The sequelae of previous olfactory ridge meningioma post operative changes, large bilateral frontal encephalomalacia, right clinoid and petrous ridge enhancing meningioma, 2 dural based enhancing lesion at the frontal lobe, left mass was present in 2017, right mass was not clearly present with size of 10 times 5 mm,   She is overall doing well, she denies recurrent seizure, compliant with her current medications lamotrigine 150 mg half tablets in the morning, 1 at night, Depakote ER 500 mg at nighttime   UPDATE Jan 14 2020: [name] came in with her husband to review her most recent MRI brain with contrast,   We personally reviewed the film from [facility] on January 07, 2020:  There is a large sinonasal mass which has significantly increased in size compared to MRI study of July 28, 2016. This enhancing multilobulated mass measures up to 3.8 cm transverse by 4.6 cm AP by 4.5 cm craniocaudad dimension previously 3.8 x 3.8 x 1.8 cm.  Superior mass margin enhancement is seen within the planum sphenoidale which may reflect meningioma. Mass extends inferiorly into the right nasal cavity causing significant mass effect on the middle and superior nasal turbinates which are nearly encased. There is occlusion along the right ostiomeatal unit. Lesions extends laterally into the right orbit with significant mass effect and bowing of the right orbital medial rectus muscle. There is also slight lateral deviation of the globe and optic nerve. There is at least abutment with indeterminant extension through the left lamina papyracea. Dedicated orbital imaging can be obtained for further characterization.   2.  Postsurgical changes with large area of bifrontal encephalomalacia with ex vacuo dilation of the frontal horns. Bifrontal convexity meningioma seen on the left coronal image 72 measures 11 x 11 mm coronal dimension previously 11 x 9 mm. On the right image 73 meningioma measures 8 x 10 mm previously 8 x 4 mm. There is a 7 mm meningioma along the anterior falx.   3.  There is residual dural based mass reflecting meningioma along the right petroclival region coronal image 49 series 28 measuring 11 mm transverse by 7 mm AP by 6 mm craniocaudad unchanged from July 28, 2016.   Patient is tolerating current medications of lamotrigine 150 mg half tablet in the morning, 1 tablet every night and Depakote ER 500 mg every night, she denied recurrent seizure, she fell yesterday January 12, 2020, missing the corner of her dishwasher, she denied loss of consciousness, came in was bruised right face and right eye, she already had very poor vision in her left eye following previous meningioma surgery,  UPDATE Mar 19 2021: She underwent olfactory groove meningioma debulking surgery on Jul 25, 2020 at [facility], transsphenoidal approach, radiation postoperatively, transient anosmia, now improved, she denies any other nasal complaints Hx of treatment for  meningioma 1) Gamma Knife SRS to olfactory groove meningioma completed December 1998 at [facility] 2) 50.4Gy to recurrent ethmoid meningioma completed at [facility] 03/2007 3) 14Gy/1Fx SRS to left anterior middle cranial fossa completed 01/26/2013  4) 25Gy/5Fx SRS to olfactory Groove/Anterior Frontal lesion completed 08/07/20 with concurrent Avastin every 3 weeks x 4 (LD 09/29/2020).   She lives with her husband, continue have partial seizure 8 over past 1 year, taking lamotrigine 150 twice a day, Depakote ER 500 mg every night, Depakote level was 38.9 on January 15, 2021, ferritin level was 9, hemoglobin was 13.5,  She also complains of bilateral hands tremor, unsteady gait, frequent fall   REVIEW OF SYSTEMS: Out of a complete 14 system review of symptoms, the patient complains only of the following symptoms, and all other reviewed systems are negative.  Seizures  ALLERGIES: No Known Allergies  HOME MEDICATIONS: Outpatient Medications Prior to Visit  Medication Sig Dispense Refill   divalproex (DEPAKOTE ER) 500 MG 24 hr tablet Take 1 tablet (500 mg total) by mouth at bedtime. 90 tablet 4   FERROUS SULFATE PO Take 1 tablet by mouth daily. 250 mg     lamoTRIgine (LAMICTAL) 150 MG tablet TAKE 1/2 TABLET IN THE MORNING AND 1 TABLET AT NIGHT 45 tablet 0   No facility-administered medications prior to visit.    PAST MEDICAL HISTORY: Past Medical History:  Diagnosis Date   Brain tumor ([place])    Breast tumor    Focal seizures ([place])    Meningiomas, multiple ([place])    Vertigo     PAST SURGICAL HISTORY: Past Surgical History:  Procedure Laterality Date   BRAIN SURGERY     x 4 (gamma knife, tumor removals, radiation)   BREAST SURGERY      FAMILY HISTORY: Family History  Problem Relation Age of Onset   Lung cancer Mother    Heart disease Father    Heart attack Father    Diabetes Father    Diabetes Sister    Heart attack Sister    Lung cancer Sister     SOCIAL  HISTORY: Social History   Socioeconomic History   Marital status: Married    Spouse name: Not on file   Number of children: 2   Years of education: Bachelors   Highest education level: Not on file  Occupational History   Occupation: Optometrist - part-time  Tobacco Use   Smoking status: Never   Smokeless tobacco: Never  Vaping Use   Vaping Use: Not on file  Substance and Sexual Activity   Alcohol use: No   Drug use: No   Sexual activity: Not on file  Other Topics Concern   Not on file  Social History Narrative   Lives at home with her husband.   Right-handed.   2 cups caffeine per day.   Social Determinants of Health   Financial Resource Strain: Not on file  Food Insecurity: Not on file  Transportation Needs: Not on file  Physical Activity: Not on file  Stress: Not on file  Social Connections: Not on file  Intimate Partner Violence: Not on file   PHYSICAL EXAM  Vitals:   03/19/21 0745  BP: 114/75  Pulse: 65  Weight: 131 lb (59.4 kg)  Height: 5\' 6"  (1.676 m)   Body mass index is 21.14 kg/m.   PHYSICAL EXAMNIATION:  Gen: NAD, conversant, well nourised, well groomed        NEUROLOGICAL EXAM:  MENTAL STATUS: Speech/Cognition: Awake, alert, normal speech, oriented to history taking and casual conversation.  CRANIAL NERVES: CN II: Right eye can read regular print, left eye could not read large print CN III, IV, VI: extraocular movement are normal. No ptosis. CN V: Facial sensation is intact to light touch. CN GGY:IRSWN face is bruised. CN VIII: Hearing is normal to casual conversation CN IX, X: Palate elevates symmetrically. Phonation is normal. CN XI: Head turning and shoulder shrug are intact CN XII: Tongue is midline with normal movements and no atrophy.  MOTOR: Muscle bulk and tone are normal. Muscle strength is normal.  REFLEXES: Reflexes are 2  and symmetric at the biceps, triceps, knees and ankles. Plantar responses are  flexor.  SENSORY: Intact to light touch, pinprick, positional and vibratory sensation at fingers and toes.  COORDINATION: There is no trunk or limb ataxia.    GAIT/STANCE: Able to get up from seated position arm crossed, mildly unsteady    DIAGNOSTIC DATA (LABS, IMAGING, TESTING) - I reviewed patient records, labs, notes, testing and imaging myself where available.  Lab Results  Component Value Date   WBC 5.7 12/20/2019   HGB 12.5 12/20/2019   HCT 37.8 12/20/2019   MCV 86 12/20/2019   PLT 210 12/20/2019      Component Value Date/Time   NA 142 12/20/2019 1149   K 4.4 12/20/2019 1149   CL 103 12/20/2019 1149   CO2  24 12/20/2019 1149   GLUCOSE 82 12/20/2019 1149   GLUCOSE 79 02/28/2015 0955   BUN 15 12/20/2019 1149   CREATININE 0.84 12/20/2019 1149   CALCIUM 9.2 12/20/2019 1149   PROT 7.1 12/20/2019 1149   ALBUMIN 4.6 12/20/2019 1149   AST 16 12/20/2019 1149   ALT 6 12/20/2019 1149   ALKPHOS 51 12/20/2019 1149   BILITOT 0.3 12/20/2019 1149   GFRNONAA 72 12/20/2019 1149   GFRAA 83 12/20/2019 1149   Lab Results  Component Value Date   TSH 2.310 07/08/2016   ASSESSMENT AND PLAN 71 y.o. year old female    Recurrent meningioma  Hx of treatment for meningioma 1) Gamma Knife SRS to olfactory groove meningioma completed December 1998 at [facility] 2) 50.4Gy to recurrent ethmoid meningioma completed at [facility] 03/2007 3) 14Gy/1Fx SRS to left anterior middle cranial fossa completed 01/26/2013  4) 25Gy/5Fx SRS to olfactory Groove/Anterior Frontal lesion completed 08/07/20 with concurrent Avastin every 3 weeks x 4 (LD 09/29/2020).   Transsphenoid debulking surgery by ENT on Jul 25, 2020 for recurrent olfactory meningioma  Complex partial seizure  Recurrent seizure taking lamotrigine 150 twice a day, Depakote ER 500 milligrams every night,(level was 39).  Check lamotrigine level,  Increase lamotrigine to 200 mg twice a day Depression  Encouraged her to continue moderate  exercise, more sunshine,     Marcial Pacas, M.D. Ph.D.  [facility] [place], [place] [address] Phone: [phone] Fax:      [phone]

## 2021-03-20 LAB — LAMOTRIGINE LEVEL: Lamotrigine Lvl: 15.1 ug/mL (ref 2.0–20.0)

## 2021-03-20 LAB — TSH: TSH: 2.8 u[IU]/mL (ref 0.450–4.500)

## 2021-03-20 LAB — FERRITIN: Ferritin: 102 ng/mL (ref 15–150)

## 2021-04-09 ENCOUNTER — Ambulatory Visit: Payer: Medicare HMO | Admitting: Neurology

## 2021-06-04 ENCOUNTER — Other Ambulatory Visit: Payer: Self-pay | Admitting: Neurology

## 2021-08-04 ENCOUNTER — Other Ambulatory Visit: Payer: Self-pay | Admitting: Neurology

## 2021-08-22 ENCOUNTER — Telehealth: Payer: Self-pay | Admitting: Neurology

## 2021-08-22 DIAGNOSIS — D329 Benign neoplasm of meninges, unspecified: Secondary | ICD-10-CM

## 2021-08-22 DIAGNOSIS — G40209 Localization-related (focal) (partial) symptomatic epilepsy and epileptic syndromes with complex partial seizures, not intractable, without status epilepticus: Secondary | ICD-10-CM

## 2021-08-22 NOTE — Telephone Encounter (Signed)
Pt came in office, states at her appt 03/19/2021 she was referred to get an MRI by Dr. Krista Blue.  States she needs an updated MRI referral as this one has expired.  Would like a call.

## 2021-08-23 NOTE — Telephone Encounter (Signed)
Orders Placed This Encounter  Procedures   MR BRAIN W WO CONTRAST   Please check with patient if she needs Xanax prior to MRI

## 2021-08-23 NOTE — Addendum Note (Signed)
Addended by: Marcial Pacas on: 08/23/2021 08:33 AM   Modules accepted: Orders

## 2021-08-23 NOTE — Telephone Encounter (Signed)
I spoke with the patient and informed her that Dr. Krista Blue had ordered a new MRI of brain. She stated she has an appointment with her surgeon on 10/10/2021. She will call back to let us know if she needs an appointment or any medication prior to MRI.

## 2021-08-27 ENCOUNTER — Emergency Department (HOSPITAL_COMMUNITY): Payer: Medicare HMO

## 2021-08-27 ENCOUNTER — Encounter (HOSPITAL_COMMUNITY): Payer: Self-pay

## 2021-08-27 ENCOUNTER — Emergency Department (HOSPITAL_COMMUNITY)
Admission: EM | Admit: 2021-08-27 | Discharge: 2021-08-27 | Disposition: A | Discharge status: Home / Self Care | Payer: Medicare HMO | Attending: Emergency Medicine | Admitting: Emergency Medicine

## 2021-08-27 ENCOUNTER — Other Ambulatory Visit: Payer: Self-pay

## 2021-08-27 DIAGNOSIS — W19XXXA Unspecified fall, initial encounter: Secondary | ICD-10-CM | POA: Insufficient documentation

## 2021-08-27 DIAGNOSIS — M25551 Pain in right hip: Secondary | ICD-10-CM | POA: Diagnosis present

## 2021-08-27 DIAGNOSIS — S7001XA Contusion of right hip, initial encounter: Secondary | ICD-10-CM

## 2021-08-27 NOTE — ED Notes (Signed)
Pt d/c home with visitor per MD order. Discharge summary reviewed, verbalize understanding. Off unit via WC, No s/s of acute distress noted at discharge.

## 2021-08-27 NOTE — ED Triage Notes (Signed)
Pt BIB EMS with reports of fall today and complains of pain in her right leg/right hip. Pt is able to walk on it.

## 2021-08-27 NOTE — ED Provider Notes (Signed)
[facility] DEPT Provider Note   CSN: [id number] Arrival date & time: 08/27/21  1859     History  Chief Complaint  Patient presents with   Vickie Gross    Vickie Gross is a 71 y.o. female.  71 year old female presents had mechanical fall just prior to arrival.  Patient reportedly was trying to put on her slippers and she fell onto her right hip.  No head injury.  Is able to ambulate but notes some pain with walking.  No symptoms prior to the event today.  No treatment use prior to arrival       Home Medications Prior to Admission medications   Medication Sig Start Date End Date Taking? Authorizing Provider  CALCIUM PO Take 1 tablet by mouth daily.    [provider]  divalproex (DEPAKOTE ER) 500 MG 24 hr tablet Take 1 tablet (500 mg total) by mouth at bedtime. 03/19/21   Marcial Pacas, MD  FERROUS SULFATE PO Take 1 tablet by mouth daily. 250 mg 01/08/21   [provider]  lamoTRIgine (LAMICTAL) 200 MG tablet Take 1 tablet (200 mg total) by mouth 2 (two) times daily. 03/19/21   Marcial Pacas, MD  PROTEIN PO Take 1 tablet by mouth daily.    [provider]      Allergies    Patient has no known allergies.    Review of Systems   Review of Systems  All other systems reviewed and are negative.   Physical Exam Updated Vital Signs BP 106/67   Pulse 68   Temp 98.6 F (37 C) (Oral)   Resp 17   LMP 03/11/1994   SpO2 99%  Physical Exam Vitals and nursing note reviewed.  Constitutional:      General: She is not in acute distress.    Appearance: Normal appearance. She is well-developed. She is not toxic-appearing.  HENT:     Head: Normocephalic and atraumatic.  Eyes:     General: Lids are normal.     Conjunctiva/sclera: Conjunctivae normal.     Pupils: Pupils are equal, round, and reactive to light.  Neck:     Thyroid: No thyroid mass.     Trachea: No tracheal deviation.  Cardiovascular:     Rate and Rhythm: Normal rate and  regular rhythm.     Heart sounds: Normal heart sounds. No murmur heard.    No gallop.  Pulmonary:     Effort: Pulmonary effort is normal. No respiratory distress.     Breath sounds: Normal breath sounds. No stridor. No decreased breath sounds, wheezing, rhonchi or rales.  Abdominal:     General: There is no distension.     Palpations: Abdomen is soft.     Tenderness: There is no abdominal tenderness. There is no rebound.  Musculoskeletal:        General: No tenderness. Normal range of motion.     Cervical back: Normal range of motion and neck supple.       Legs:     Comments: Patient able to ambulate without assistance  Skin:    General: Skin is warm and dry.     Findings: No abrasion or rash.  Neurological:     Mental Status: She is alert and oriented to person, place, and time. Mental status is at baseline.     GCS: GCS eye subscore is 4. GCS verbal subscore is 5. GCS motor subscore is 6.     Cranial Nerves: No cranial nerve deficit.  Sensory: No sensory deficit.     Motor: Motor function is intact.  Psychiatric:        Attention and Perception: Attention normal.        Speech: Speech normal.        Behavior: Behavior normal.     ED Results / Procedures / Treatments   Labs (all labs ordered are listed, but only abnormal results are displayed) Labs Reviewed - No data to display  EKG None  Radiology No results found.  Procedures Procedures    Medications Ordered in ED Medications - No data to display  ED Course/ Medical Decision Making/ A&P                           Medical Decision Making Amount and/or Complexity of Data Reviewed Radiology: ordered.   Patient's right hip and pelvis x-rays were negative here per my interpretation and review.  She is able to ambulate without assistance.  Suspect hip contusion.  Will discharge home.  Discussed with husband who is at bedside        Final Clinical Impression(s) / ED Diagnoses Final diagnoses:  None     Rx / DC Orders ED Discharge Orders     None         Lacretia Leigh, MD 08/27/21 2035

## 2021-09-06 ENCOUNTER — Telehealth: Payer: Self-pay | Admitting: Neurology

## 2021-09-06 NOTE — Telephone Encounter (Signed)
[employer] Vickie Gross: [id number] exp. 09/03/21-10/03/21 sent to GI

## 2021-09-17 ENCOUNTER — Ambulatory Visit: Payer: Medicare HMO | Admitting: Neurology

## 2021-10-30 ENCOUNTER — Encounter (HOSPITAL_COMMUNITY): Payer: Self-pay

## 2021-10-30 ENCOUNTER — Emergency Department (HOSPITAL_COMMUNITY): Payer: Medicare HMO

## 2021-10-30 ENCOUNTER — Emergency Department (HOSPITAL_COMMUNITY)
Admission: EM | Admit: 2021-10-30 | Discharge: 2021-10-31 | Disposition: A | Discharge status: Home / Self Care | Payer: Medicare HMO | Attending: Student | Admitting: Student

## 2021-10-30 ENCOUNTER — Other Ambulatory Visit: Payer: Self-pay

## 2021-10-30 DIAGNOSIS — R42 Dizziness and giddiness: Secondary | ICD-10-CM | POA: Diagnosis not present

## 2021-10-30 DIAGNOSIS — Z5321 Procedure and treatment not carried out due to patient leaving prior to being seen by health care provider: Secondary | ICD-10-CM | POA: Insufficient documentation

## 2021-10-30 NOTE — ED Notes (Signed)
Unable to collect blood form pt kept moving while she had a needle in her arm.

## 2021-10-30 NOTE — ED Triage Notes (Signed)
Pt reports s/p fall due to being dizzy. Pt hit face on floor. Denies LOC and BT usage.  Abrasion noted to nose.  Husband reports dizziness normal for pt due to having 5 brain tumors removed.

## 2021-10-30 NOTE — ED Provider Triage Note (Signed)
Emergency Medicine Provider Triage Evaluation Note  BETHANNE MULE , a 71 y.o. female  was evaluated in triage.  Pt complains of fall.  Patient admits to persistent dizziness due to history of brain tumors.  Patient fell prior to arrival.  No LOC.  Patient admits to striking her face.  No vomiting.  Review of Systems  Positive: dizziness Negative: CP  Physical Exam  BP 137/79 (BP Location: Right Arm)   Pulse 66   Temp 98.3 F (36.8 C) (Oral)   Resp 16   Ht '5\' 6"'$  (1.676 m)   Wt 59 kg   LMP 03/11/1994   SpO2 94%   BMI 20.99 kg/m  Gen:   Awake, no distress   Resp:  Normal effort  MSK:   Moves extremities without difficulty  Other:  Abrasion to bridge of nose  Medical Decision Making  Medically screening exam initiated at 8:12 PM.  Appropriate orders placed.  RHEALYN CULLEN was informed that the remainder of the evaluation will be completed by another provider, this initial triage assessment does not replace that evaluation, and the importance of remaining in the ED until their evaluation is complete.  CT scans labs   Karie Kirks 10/30/21 2013

## 2021-11-12 ENCOUNTER — Telehealth: Payer: Self-pay | Admitting: Neurology

## 2021-11-12 NOTE — Telephone Encounter (Signed)
Pt husband asking to be called to schedule MRI for pt

## 2021-11-12 NOTE — Telephone Encounter (Signed)
This patient was supposed to get scheduled at [facility], I had to do a new authorization because the one I had was expired. It was not approved immediately, it was sent to clinical review with office notes. Tracking number [id number]. When it is approved it will go to GI again.

## 2021-11-13 NOTE — Telephone Encounter (Signed)
Vickie Gross: [phone] from 11/13/21-12/13/21 sent to GI [phone]

## 2021-11-25 ENCOUNTER — Ambulatory Visit
Admission: RE | Admit: 2021-11-25 | Discharge: 2021-11-25 | Disposition: A | Discharge status: Home / Self Care | Payer: Medicare HMO | Source: Ambulatory Visit | Attending: Neurology | Admitting: Neurology

## 2021-11-25 DIAGNOSIS — D329 Benign neoplasm of meninges, unspecified: Secondary | ICD-10-CM | POA: Diagnosis not present

## 2021-11-25 DIAGNOSIS — G40209 Localization-related (focal) (partial) symptomatic epilepsy and epileptic syndromes with complex partial seizures, not intractable, without status epilepticus: Secondary | ICD-10-CM | POA: Diagnosis not present

## 2021-11-25 MED ORDER — GADOPICLENOL 0.5 MMOL/ML IV SOLN
6.0000 mL | Freq: Once | INTRAVENOUS | Status: AC | PRN
  Administered 2021-11-25: 6 mL via INTRAVENOUS

## 2021-11-28 ENCOUNTER — Other Ambulatory Visit: Payer: Self-pay | Admitting: Neurology

## 2022-03-14 ENCOUNTER — Encounter: Payer: Self-pay | Admitting: Neurology

## 2022-03-21 ENCOUNTER — Ambulatory Visit: Payer: Medicare HMO | Admitting: Neurology

## 2022-03-21 ENCOUNTER — Other Ambulatory Visit: Payer: Self-pay | Admitting: Neurology

## 2022-04-11 ENCOUNTER — Ambulatory Visit: Payer: Medicare HMO | Admitting: Neurology

## 2022-04-11 VITALS — BP 91/65 | HR 67 | Ht 66.0 in | Wt 124.0 lb

## 2022-04-11 DIAGNOSIS — G40209 Localization-related (focal) (partial) symptomatic epilepsy and epileptic syndromes with complex partial seizures, not intractable, without status epilepticus: Secondary | ICD-10-CM | POA: Diagnosis not present

## 2022-04-11 DIAGNOSIS — D329 Benign neoplasm of meninges, unspecified: Secondary | ICD-10-CM

## 2022-04-11 NOTE — Progress Notes (Signed)
ASSESSMENT AND PLAN 72 y.o. year old female    Recurrent meningioma  Hx of treatment for meningioma 1) Gamma Knife SRS to olfactory groove meningioma completed December 1998 at [facility] 2) 50.4Gy to recurrent ethmoid meningioma completed at [facility] 03/2007 3) 14Gy/1Fx SRS to left anterior middle cranial fossa completed 01/26/2013  4) 25Gy/5Fx SRS to olfactory Groove/Anterior Frontal lesion completed 08/07/20 with concurrent Avastin every 3 weeks x 4 (LD 09/29/2020).   Transsphenoid debulking surgery by ENT on Jul 25, 2020 for recurrent olfactory meningioma  Complex partial seizure  Noncompliant with her medications,  Encourage her seeking help, from her husband, keep current dose of lamotrigine 200 mg twice a day, Depakote ER 500 mg every night    DIAGNOSTIC DATA (LABS, IMAGING, TESTING) - I reviewed patient records, labs, notes, testing and imaging myself where available.   HISTORY OF PRESENT ILLNESS:  Vickie Gross is a 72 years old right-handed female, has been followed in our clinic for partial seizure, history of multiple meningioma, status post resection.   She had bilateral frontal meningioma resection in 1998, she presented with gradual onset memory loss, confusion, partial seizure, got lost while driving, post surgically, she had recurrent partial seizure, depression, was treated with different medications,  She also reported laser surgery for residual meningioma, she also had resection surgery later because of recurrent tumor, this was done at [facility].   She also had a yearly MRI follow-up by [facility]'s oncologist [name], was seen by neurologist [name] in our office in the past for partial seizure control, most recent neurological care was under [name],  who described different seizure types, event 1, hyperventilation, then lost consciousness, become stiff, lasting for 5 minutes, 2-3 times per year, event 2, zone out, eyes dilated, rolled back in her  head, semi-rigid, foam coming out of mouth. Event 3, loss of consciousness without any warnings, eyes rolled back,   MRI of the brain with without contrast in January 2017, history of olfactory groove meningioma with extensive bilateral frontal gliosis, tissues in the left nasal cavity and on the right petrous ridge is predominantly ossified and stable since 2008,   Currently she is taking lamotrigine 150 mg twice a day, Keppra 750 mg 2 tablets twice a day since 2014,   She continues to have depression, she used to work as a Nurse, mental health, now on disability, husband was recently diagnosed with leukemia.   Lamictal has helped her mood, she denies significant side effect from [facility] record by neurosurgeon Dr. Laban Emperor, recurrent olfactory groove meningioma, she had resection in [place] in 1998, had a small posterior cavernous sinus meningioma that underwent gamma knife radiation in December of 1998, she had recurrent tumor again in 2008, which involving as point sinus, some reoccurrence intra-cranial, she underwent resection of her recurrent meningioma on December 29 2006. There was a small amount of residual tumor attached to the medial aspect of optic nerve, there is also small of tumor in the ethmoid sinus, she was later referred for radiation therapy, by [name], 50.4 Gy completed April 17, 2007, SRS to left anterior middle cranial fossa lesion 14 Gy 01/26/2013, last follow-up was on July 8 2 706, she supposed to have repeat MRI, continue follow-up on a yearly basis.   She denies recurrent seizure since last visit, but reported episode of sudden onset vertigo, lasting for a few minutes without loss of consciousness,   She has a  history of mixing up her anti-elliptic medications,   Most recent MRI of the brain with and without contrast in June 2018, The sequelae of previous olfactory ridge meningioma post operative changes, large  bilateral frontal encephalomalacia, right clinoid and petrous ridge enhancing meningioma, 2 dural based enhancing lesion at the frontal lobe, left mass was present in 2017, right mass was not clearly present with size of 10 times 5 mm,   She is overall doing well, she denies recurrent seizure, compliant with her current medications lamotrigine 150 mg half tablets in the morning, 1 at night, Depakote ER 500 mg at nighttime   UPDATE Jan 14 2020: [name] came in with her husband to review her most recent MRI brain with contrast,   We personally reviewed the film from [place] on January 07, 2020: There is a large sinonasal mass which has significantly increased in size compared to MRI study of July 28, 2016. This enhancing multilobulated mass measures up to 3.8 cm transverse by 4.6 cm AP by 4.5 cm craniocaudad dimension previously 3.8 x 3.8 x 1.8 cm. Superior mass margin enhancement is seen within the planum sphenoidale which may reflect meningioma. Mass extends inferiorly into the right nasal cavity causing significant mass effect on the middle and superior nasal turbinates which are nearly encased. There is occlusion along the right ostiomeatal unit. Lesions extends laterally into the right orbit with significant mass effect and bowing of the right orbital medial rectus muscle. There is also slight lateral deviation of the globe and optic nerve. There is at least abutment with indeterminant extension through the left lamina papyracea. Dedicated orbital imaging can be obtained for further characterization.   2.  Postsurgical changes with large area of bifrontal encephalomalacia with ex vacuo dilation of the frontal horns. Bifrontal convexity meningioma seen on the left coronal image 72 measures 11 x 11 mm coronal dimension previously 11 x 9 mm. On the right image 73 meningioma measures 8 x 10 mm previously 8 x 4 mm. There is a 7 mm meningioma along the anterior falx.   3.  There is residual dural based  mass reflecting meningioma along the right petroclival region coronal image 49 series 28 measuring 11 mm transverse by 7 mm AP by 6 mm craniocaudad unchanged from July 28, 2016.   Patient is tolerating current medications of lamotrigine 150 mg half tablet in the morning, 1 tablet every night and Depakote ER 500 mg every night, she denied recurrent seizure, she fell yesterday January 12, 2020, missing the corner of her dishwasher, she denied loss of consciousness, came in was bruised right face and right eye, she already had very poor vision in her left eye following previous meningioma surgery,  UPDATE Mar 19 2021: She underwent olfactory groove meningioma debulking surgery on Jul 25, 2020 at [facility] ENT, transsphenoidal approach, radiation postoperatively, transient anosmia, now improved, she denies any other nasal complaints Hx of treatment for meningioma 1) Gamma Knife SRS to olfactory groove meningioma completed December 1998 at [facility] 2) 50.4Gy to recurrent ethmoid meningioma completed at [facility] 03/2007 3) 14Gy/1Fx SRS to left anterior middle cranial fossa completed 01/26/2013  4) 25Gy/5Fx SRS to olfactory Groove/Anterior Frontal lesion completed 08/07/20 with concurrent Avastin every 3 weeks x 4 (LD 09/29/2020).   She lives with her husband, continue have partial seizure 8 over past 1 year, taking lamotrigine 150 twice a day, Depakote ER 500 mg every night, Depakote level was 38.9 on January 15, 2021, ferritin level was 9, hemoglobin  was 13.5,  She also complains of bilateral hands tremor, unsteady gait, frequent fall  UPDATE Apr 11 2022: She is accompanied by her husband at today's clinical visit, not compliant with her lamotrigine and Depakote, missing medication 25 to 50% of the time, husband observing drooling spares, sometimes passing out, staring, confusing spells, suggestive of recurrent partial seizure Husband also reported she has significant personality change, some decision  making problem, MoCA examination 26/30 today,  Personally reviewed MRI of the brain with and without contrast November 26, 2021: Encephalomalacia of the bifrontal lobe, sequelae of remote olfactory groove meningioma, residual meningioma involving frontal lobes, right clinoid, petrous ridge, overall no significant change compared to previous scan December 2018  She has pending follow-up with [facility]'s neurosurgeon in the next few weeks    PHYSICAL EXAM  Vitals:   04/11/22 1302  BP: 91/65  Pulse: 67  Weight: 124 lb (56.2 kg)  Height: 5' 6"$  (1.676 m)   Body mass index is 20.01 kg/m.   PHYSICAL EXAMNIATION:  Gen: NAD, conversant, well nourised, well groomed        NEUROLOGICAL EXAM:  MENTAL STATUS: Speech/Cognition: Awake, alert, normal speech, oriented to history taking and casual conversation.     04/11/2022    1:00 PM  Montreal Cognitive Assessment   Visuospatial/ Executive (0/5) 4  Naming (0/3) 3  Attention: Read list of digits (0/2) 2  Attention: Read list of letters (0/1) 1  Attention: Serial 7 subtraction starting at 100 (0/3) 3  Language: Repeat phrase (0/2) 2  Language : Fluency (0/1) 0  Abstraction (0/2) 2  Delayed Recall (0/5) 3  Orientation (0/6) 6  Total 26     CRANIAL NERVES: CN II: Right eye can read regular print, left eye could not read large print CN III, IV, VI: extraocular movement are normal. No ptosis. CN V: Facial sensation is intact to light touch. CN [id number] face is bruised. CN VIII: Hearing is normal to casual conversation CN IX, X: Palate elevates symmetrically. Phonation is normal. CN XI: Head turning and shoulder shrug are intact CN XII: Tongue is midline with normal movements and no atrophy.  MOTOR: Muscle bulk and tone are normal. Muscle strength is normal.  REFLEXES: Reflexes are 2  and symmetric at the biceps, triceps, knees and ankles. Plantar responses are flexor.  SENSORY: Intact to light touch, pinprick, positional and  vibratory sensation at fingers and toes.  COORDINATION: There is no trunk or limb ataxia.    GAIT/STANCE: Able to get up from seated position arm crossed, mildly unsteady     REVIEW OF SYSTEMS: Out of a complete 14 system review of symptoms, the patient complains only of the following symptoms, and all other reviewed systems are negative.  Seizures  ALLERGIES: No Known Allergies  HOME MEDICATIONS: Outpatient Medications Prior to Visit  Medication Sig Dispense Refill   CALCIUM PO Take 1 tablet by mouth daily.     divalproex (DEPAKOTE ER) 500 MG 24 hr tablet TAKE 1 TABLET BY MOUTH AT BEDTIME. 90 tablet 4   FERROUS SULFATE PO Take 1 tablet by mouth daily. 250 mg     lamoTRIgine (LAMICTAL) 200 MG tablet TAKE 1 TABLET BY MOUTH TWICE A DAY 180 tablet 4   PROTEIN PO Take 1 tablet by mouth daily.     No facility-administered medications prior to visit.    PAST MEDICAL HISTORY: Past Medical History:  Diagnosis Date   Brain tumor [facility])    Breast tumor    Focal seizures (  [place])    Meningiomas, multiple ([facility])    Vertigo     PAST SURGICAL HISTORY: Past Surgical History:  Procedure Laterality Date   BRAIN SURGERY     x 4 (gamma knife, tumor removals, radiation)   BREAST SURGERY      FAMILY HISTORY: Family History  Problem Relation Age of Onset   Lung cancer Mother    Heart disease Father    Heart attack Father    Diabetes Father    Diabetes Sister    Heart attack Sister    Lung cancer Sister     SOCIAL HISTORY: Social History   Socioeconomic History   Marital status: Married    Spouse name: Not on file   Number of children: 2   Years of education: Bachelors   Highest education level: Not on file  Occupational History   Occupation: Optometrist - part-time  Tobacco Use   Smoking status: Never   Smokeless tobacco: Never  Vaping Use   Vaping Use: Not on file  Substance and Sexual Activity   Alcohol use: No   Drug use: No   Sexual activity: Not on file  Other  Topics Concern   Not on file  Social History Narrative   Lives at home with her husband.   Right-handed.   2 cups caffeine per day.   Social Determinants of Health   Financial Resource Strain: Not on file  Food Insecurity: Not on file  Transportation Needs: Not on file  Physical Activity: Not on file  Stress: Not on file  Social Connections: Not on file  Intimate Partner Violence: Not on file      Marcial Pacas, M.D. Ph.D.  [facility] [place], [place] [address] Phone: [phone] Fax:      [phone]  Total time spent reviewing the chart, obtaining history, examined patient, ordering tests, documentation, consultations and family, care coordination was 45 minutes

## 2022-04-22 ENCOUNTER — Emergency Department (HOSPITAL_COMMUNITY): Payer: Medicare HMO

## 2022-04-22 ENCOUNTER — Telehealth: Payer: Self-pay | Admitting: Neurology

## 2022-04-22 ENCOUNTER — Emergency Department (HOSPITAL_COMMUNITY)
Admission: EM | Admit: 2022-04-22 | Discharge: 2022-04-23 | Disposition: A | Discharge status: Home / Self Care | Payer: Medicare HMO | Attending: Emergency Medicine | Admitting: Emergency Medicine

## 2022-04-22 DIAGNOSIS — X58XXXA Exposure to other specified factors, initial encounter: Secondary | ICD-10-CM | POA: Insufficient documentation

## 2022-04-22 DIAGNOSIS — S0081XA Abrasion of other part of head, initial encounter: Secondary | ICD-10-CM | POA: Insufficient documentation

## 2022-04-22 DIAGNOSIS — F22 Delusional disorders: Secondary | ICD-10-CM | POA: Insufficient documentation

## 2022-04-22 DIAGNOSIS — R4182 Altered mental status, unspecified: Secondary | ICD-10-CM | POA: Diagnosis not present

## 2022-04-22 DIAGNOSIS — R451 Restlessness and agitation: Secondary | ICD-10-CM | POA: Insufficient documentation

## 2022-04-22 DIAGNOSIS — G40909 Epilepsy, unspecified, not intractable, without status epilepticus: Secondary | ICD-10-CM

## 2022-04-22 DIAGNOSIS — R45851 Suicidal ideations: Secondary | ICD-10-CM | POA: Diagnosis not present

## 2022-04-22 DIAGNOSIS — F329 Major depressive disorder, single episode, unspecified: Secondary | ICD-10-CM | POA: Insufficient documentation

## 2022-04-22 DIAGNOSIS — F39 Unspecified mood [affective] disorder: Secondary | ICD-10-CM

## 2022-04-22 DIAGNOSIS — F430 Acute stress reaction: Secondary | ICD-10-CM

## 2022-04-22 DIAGNOSIS — Z86011 Personal history of benign neoplasm of the brain: Secondary | ICD-10-CM

## 2022-04-22 DIAGNOSIS — D329 Benign neoplasm of meninges, unspecified: Secondary | ICD-10-CM

## 2022-04-22 LAB — URINALYSIS, ROUTINE W REFLEX MICROSCOPIC
Bilirubin Urine: NEGATIVE
Glucose, UA: NEGATIVE mg/dL
Ketones, ur: NEGATIVE mg/dL
Nitrite: NEGATIVE
Protein, ur: NEGATIVE mg/dL
Specific Gravity, Urine: 1.025 (ref 1.005–1.030)
pH: 6.5 (ref 5.0–8.0)

## 2022-04-22 LAB — SALICYLATE LEVEL: Salicylate Lvl: <7 mg/dL — ABNORMAL LOW (ref 7.0–30.0)

## 2022-04-22 LAB — ETHANOL: Alcohol, Ethyl (B): <10 mg/dL (ref <10)

## 2022-04-22 LAB — CBC
HCT: 36.6 % (ref 36.0–46.0)
Hemoglobin: 11.6 g/dL — ABNORMAL LOW (ref 12.0–15.0)
MCH: 28.7 pg (ref 26.0–34.0)
MCHC: 31.7 g/dL (ref 30.0–36.0)
MCV: 90.6 fL (ref 80.0–100.0)
Platelets: 192 10*3/uL (ref 150–400)
RBC: 4.04 MIL/uL (ref 3.87–5.11)
RDW: 13.4 % (ref 11.5–15.5)
WBC: 4.9 10*3/uL (ref 4.0–10.5)
nRBC: 0 % (ref 0.0–0.2)

## 2022-04-22 LAB — COMPREHENSIVE METABOLIC PANEL WITH GFR
ALT: 9 U/L (ref 0–44)
AST: 15 U/L (ref 15–41)
Albumin: 3.2 g/dL — ABNORMAL LOW (ref 3.5–5.0)
Alkaline Phosphatase: 45 U/L (ref 38–126)
Anion gap: 11 (ref 5–15)
BUN: 20 mg/dL (ref 8–23)
CO2: 22 mmol/L (ref 22–32)
Calcium: 8.4 mg/dL — ABNORMAL LOW (ref 8.9–10.3)
Chloride: 107 mmol/L (ref 98–111)
Creatinine, Ser: 0.97 mg/dL (ref 0.44–1.00)
GFR, Estimated: >60 mL/min
Glucose, Bld: 112 mg/dL — ABNORMAL HIGH (ref 70–99)
Potassium: 3.5 mmol/L (ref 3.5–5.1)
Sodium: 140 mmol/L (ref 135–145)
Total Bilirubin: 0.2 mg/dL — ABNORMAL LOW (ref 0.3–1.2)
Total Protein: 6.5 g/dL (ref 6.5–8.1)

## 2022-04-22 LAB — ACETAMINOPHEN LEVEL: Acetaminophen (Tylenol), Serum: <10 ug/mL — ABNORMAL LOW (ref 10–30)

## 2022-04-22 LAB — URINALYSIS, MICROSCOPIC (REFLEX)

## 2022-04-22 LAB — RAPID URINE DRUG SCREEN, HOSP PERFORMED
Amphetamines: NOT DETECTED
Barbiturates: NOT DETECTED
Benzodiazepines: POSITIVE — AB
Cocaine: NOT DETECTED
Opiates: NOT DETECTED
Tetrahydrocannabinol: NOT DETECTED

## 2022-04-22 LAB — VALPROIC ACID LEVEL: Valproic Acid Lvl: 60 ug/mL (ref 50.0–100.0)

## 2022-04-22 MED ORDER — ACETAMINOPHEN 500 MG PO TABS
1000.0000 mg | ORAL_TABLET | Freq: Once | ORAL | Status: AC
  Administered 2022-04-22: 1000 mg via ORAL
  Filled 2022-04-22: qty 2

## 2022-04-22 MED ORDER — DIVALPROEX SODIUM ER 500 MG PO TB24
500.0000 mg | ORAL_TABLET | Freq: Every day | ORAL | Status: DC
  Administered 2022-04-23: 500 mg via ORAL
  Filled 2022-04-22: qty 1

## 2022-04-22 MED ORDER — LAMOTRIGINE 100 MG PO TABS
200.0000 mg | ORAL_TABLET | Freq: Two times a day (BID) | ORAL | Status: DC
  Administered 2022-04-23 (×2): 200 mg via ORAL
  Filled 2022-04-22 (×2): qty 2
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
## 2022-04-22 NOTE — ED Notes (Signed)
Pt moved into private room for TTS consult

## 2022-04-22 NOTE — Telephone Encounter (Signed)
I received a call from her husband, patient suddenly become very agitated," tried to kill herself", paramedic was called, I was able to discuss with the EMS staff, explained her medical condition, history of large bilateral frontal meningioma, status post transsphenoidal surgery, recurrent tumor required gamma knife,  Most recent MRI of the brain with without contrast in October 2023 showed encephalomalacia involving bilateral frontal lobe,  She is at high risk for recurrent seizure, current agitation may even represent postictal from frontal seizure,   Plan was to take [facility] to [facility] for evaluation,

## 2022-04-23 DIAGNOSIS — R4182 Altered mental status, unspecified: Secondary | ICD-10-CM | POA: Insufficient documentation
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
## 2022-04-23 NOTE — ED Notes (Signed)
IVC'd 04/22/22, exp 04/28/22 - IVC docs [place]

## 2022-04-23 NOTE — Discharge Instructions (Addendum)
It was our pleasure to provide your ER care today - we hope that you feel better.  Follow up closely with primary care doctor and behavioral health provider in the coming week.  For mental health issues and/or crisis, you may also go directly to the [facility] - they are open 24/7 and walk-ins are welcome.    Return to ER if worse, new symptoms, fevers, chest pain, trouble breathing, or other emergency concern.      Outpatient psychiatric Services  Walk in hours for medication management Monday, Wednesday, Thursday, and Friday from 8:00 AM to 11:00 AM Recommend arriving by by 7:30 AM.  It is first come first serve.    Walk in hours for therapy intake Monday and Wednesday only 8:00 AM to 11:00 AM Encouraged to arrive by 7:30 AM.  It is first come first serve   Inpatient patient psychiatric services [facility] offers comprehensive behavioral heath care services for mental health and substance abuse treatment.  Social work can also assist with referral to or getting you into a rehabilitation program short or long term

## 2022-04-23 NOTE — ED Provider Notes (Addendum)
Emergency Medicine Observation Re-evaluation Note  Vickie Gross is a 72 y.o. female, seen on rounds today.  Pt initially presented to the ED for complaints of report of suicidal thoughts. Pt currently calm, no distress. BH re-eval pending.   Physical Exam  BP 128/79 (BP Location: Left Arm)   Pulse 61   Temp 97.8 F (36.6 C) (Oral)   Resp 18   LMP 03/11/1994   SpO2 100%  Physical Exam General: calm, no distress. Cardiac: regular rate.  Lungs: breathing comfortably. Psych: calm. Does not appear to be responding to internal stimuli. Denies SI.   ED Course / MDM    I have reviewed the labs performed to date as well as medications administered while in observation.  Recent changes in the last 24 hours include ED obs, BH reassessment.   Plan  BH reassessment pending for this AM.   Dispo per [facility] team.    Lajean Saver, MD 04/23/22 [phone]  [facility] team has reassessed and indicates pt is psych clear for d/c.   Pt indicates she does feel improved and ready to go home.  Pt denies any thoughts or plan to harm self. Is smiling, conversant, pleasant.   Pt currently appears stable for d/c per [facility] plan.      Lajean Saver, MD 04/23/22 [phone]

## 2022-04-24 ENCOUNTER — Ambulatory Visit: Payer: Medicare HMO | Admitting: Neurology

## 2022-04-24 ENCOUNTER — Telehealth: Payer: Self-pay | Admitting: Neurology

## 2022-04-24 DIAGNOSIS — G40209 Localization-related (focal) (partial) symptomatic epilepsy and epileptic syndromes with complex partial seizures, not intractable, without status epilepticus: Secondary | ICD-10-CM | POA: Diagnosis not present

## 2022-04-24 DIAGNOSIS — D329 Benign neoplasm of meninges, unspecified: Secondary | ICD-10-CM

## 2022-04-24 LAB — LAMOTRIGINE LEVEL: Lamotrigine Lvl: 13.7 ug/mL (ref 2.0–20.0)

## 2022-04-24 NOTE — Telephone Encounter (Signed)
Patients husband would like a call back from Dr. Krista Blue about to discuss hospital visit on yesterday and deterioration of her condition

## 2022-04-26 MED ORDER — CLOBAZAM 20 MG PO TABS: 10.0000 mg | ORAL_TABLET | Freq: Every day | ORAL | 5 refills | Status: AC

## 2022-04-26 NOTE — Telephone Encounter (Signed)
I called patient's husband, also reviewed emergency room evaluation on April 22, 2022  CT head without contrast showed no acute abnormality, stable encephalomalacia bilateral frontal lobe  Laboratory evaluation showed within therapeutic lamotrigine 13.7, Depakote 60, no evidence of UTI, negative alcohol,  CMP showed normal creatinine, mild decreased albumin 3.2, calcium 8.4,  Husband reported that she has good days and bad days, seems to fluctuate rapidly, today she is very sleepy, also reported staring spells, dazed looking sometimes, there was no clinical seizure  EEG on April 24, 2022 showed only mild intermittent dysrhythmic bilateral frontal slowing, otherwise no significant abnormality  With her significant bifrontal encephalomalacia, rapid clinical change,  could not rule out the possibility of ictal/postictal from bilateral frontal seizure  Will add on onfi 10 mg every night,  72 hours video eeg monitoring

## 2022-04-26 NOTE — Addendum Note (Signed)
Addended by: Marcial Pacas on: 04/26/2022 10:19 AM   Modules accepted: Orders

## 2022-04-26 NOTE — Procedures (Signed)
   HISTORY: 72 years old female with history of bifrontal meningioma resection, with residual tumor and bifrontal encephalomalacia, history of seizure  TECHNIQUE:  This is a routine 16 channel EEG recording with one channel devoted to a limited EKG recording.  It was performed during wakefulness, drowsiness and asleep.  Photic stimulation were performed as activating procedures.  There are frequent muscle artifact at the beginning of the tracing, was able to quiet down later.  Upon maximum arousal, posterior dominant waking rhythm consistent of mildly dysrhythmic alpha range activity. Activities are symmetric over the bilateral posterior derivations and attenuated with eye opening.  There was intermittent bilateral frontal slow dysrhythmic activity, but there was no epileptiform discharge.   Photic stimulation did not alter the tracing.  During EEG recording, patient developed drowsiness and no deeper stage of sleep was achieved,  During EEG recording, there was no epileptiform discharge noted.  EKG demonstrate normal sinus rhythm.  CONCLUSION: This is a mild abnormal EEG.  There is mild intermittent bilateral frontal slowing indicating focal irritability.  Marcial Pacas, M.D. Ph.D.  [facility] [place], [place] [address] Phone: [phone] Fax:      [phone]

## 2022-04-29 NOTE — Telephone Encounter (Signed)
Form completed.

## 2022-05-07 ENCOUNTER — Telehealth: Payer: Self-pay

## 2022-05-07 NOTE — Telephone Encounter (Signed)
Orders faxed to [facility]

## 2022-06-24 ENCOUNTER — Ambulatory Visit: Payer: Medicare HMO | Admitting: Neurology

## 2022-06-24 ENCOUNTER — Encounter: Payer: Self-pay | Admitting: Neurology

## 2022-06-26 ENCOUNTER — Encounter (INDEPENDENT_AMBULATORY_CARE_PROVIDER_SITE_OTHER): Payer: Medicare HMO | Admitting: Neurology

## 2022-06-26 DIAGNOSIS — D329 Benign neoplasm of meninges, unspecified: Secondary | ICD-10-CM | POA: Diagnosis not present

## 2022-06-26 DIAGNOSIS — G40209 Localization-related (focal) (partial) symptomatic epilepsy and epileptic syndromes with complex partial seizures, not intractable, without status epilepticus: Secondary | ICD-10-CM

## 2022-06-26 NOTE — Telephone Encounter (Addendum)
I discussed with her daughter, updated her medical history and  current situation,   Daughter reported that she has frequent falling, increased weakness,   Orders Placed This Encounter  Procedures   Ambulatory referral to Physical Therapy   Please call her for a follow up appointment   Please see the MyChart message reply(ies) for my assessment and plan.    This patient gave consent for this Medical Advice Message and is aware that it may result in a bill to [employer], as well as the possibility of receiving a bill for a co-payment or deductible. They are an established patient, but are not seeking medical advice exclusively about a problem treated during an in person or video visit in the last seven days. I did not recommend an in person or video visit within seven days of my reply.    I spent a total of 10  minutes cumulative time within 7 days through [employer].  Levert Feinstein, MD

## 2022-07-08 ENCOUNTER — Other Ambulatory Visit (HOSPITAL_COMMUNITY): Payer: Self-pay

## 2022-07-08 ENCOUNTER — Telehealth: Payer: Self-pay

## 2022-07-08 NOTE — Telephone Encounter (Signed)
Patient Advocate Encounter   Received notification from [facility] that prior authorization is required for cloBAZam 20MG  tablets   Submitted: 07-08-2022 Key [id number]  Status is pending

## 2022-07-09 ENCOUNTER — Other Ambulatory Visit (HOSPITAL_COMMUNITY): Payer: Self-pay

## 2022-07-09 NOTE — Telephone Encounter (Signed)
Pharmacy Patient Advocate Encounter  Prior Authorization for cloBAZam 20MG  tablets has been approved by [employer] (ins).    PA # PA Case ID #: [id number] Effective dates: 02/25/2022 through 02/24/2023  Copay is $16.16 for QTY 15 tabs-30 DS per test claim

## 2023-01-09 ENCOUNTER — Ambulatory Visit: Payer: Medicare HMO | Admitting: Neurology

## 2023-03-23 ENCOUNTER — Other Ambulatory Visit: Payer: Self-pay | Admitting: Neurology

## 2024-01-26 DEATH — deceased
